# Patient Record
Sex: Female | Born: 1950 | Race: Asian | Hispanic: No | Marital: Married | Smoking: Never smoker
Health system: Southern US, Community
[De-identification: ages and names within clinical notes are randomized; demographics above are authoritative.]

## PROBLEM LIST (undated history)

## (undated) DIAGNOSIS — R42 Dizziness and giddiness: Secondary | ICD-10-CM

## (undated) DIAGNOSIS — I10 Essential (primary) hypertension: Secondary | ICD-10-CM

## (undated) DIAGNOSIS — M810 Age-related osteoporosis without current pathological fracture: Secondary | ICD-10-CM

## (undated) DIAGNOSIS — E785 Hyperlipidemia, unspecified: Secondary | ICD-10-CM

## (undated) HISTORY — DX: Hyperlipidemia, unspecified: E78.5

## (undated) HISTORY — DX: Age-related osteoporosis without current pathological fracture: M81.0

---

## 2012-04-20 ENCOUNTER — Emergency Department (HOSPITAL_COMMUNITY)
Admission: EM | Admit: 2012-04-20 | Discharge: 2012-04-20 | Disposition: A | Discharge status: Home / Self Care | Payer: BC Managed Care – PPO | Attending: Emergency Medicine | Admitting: Emergency Medicine

## 2012-04-20 ENCOUNTER — Emergency Department (HOSPITAL_COMMUNITY): Payer: BC Managed Care – PPO

## 2012-04-20 ENCOUNTER — Encounter (HOSPITAL_COMMUNITY): Payer: Self-pay | Admitting: *Deleted

## 2012-04-20 DIAGNOSIS — M8448XA Pathological fracture, other site, initial encounter for fracture: Secondary | ICD-10-CM | POA: Insufficient documentation

## 2012-04-20 DIAGNOSIS — I959 Hypotension, unspecified: Secondary | ICD-10-CM | POA: Insufficient documentation

## 2012-04-20 DIAGNOSIS — Z8639 Personal history of other endocrine, nutritional and metabolic disease: Secondary | ICD-10-CM | POA: Insufficient documentation

## 2012-04-20 DIAGNOSIS — Z862 Personal history of diseases of the blood and blood-forming organs and certain disorders involving the immune mechanism: Secondary | ICD-10-CM | POA: Insufficient documentation

## 2012-04-20 DIAGNOSIS — D649 Anemia, unspecified: Secondary | ICD-10-CM | POA: Insufficient documentation

## 2012-04-20 DIAGNOSIS — R110 Nausea: Secondary | ICD-10-CM | POA: Insufficient documentation

## 2012-04-20 DIAGNOSIS — S32010A Wedge compression fracture of first lumbar vertebra, initial encounter for closed fracture: Secondary | ICD-10-CM

## 2012-04-20 DIAGNOSIS — I10 Essential (primary) hypertension: Secondary | ICD-10-CM | POA: Insufficient documentation

## 2012-04-20 HISTORY — DX: Essential (primary) hypertension: I10

## 2012-04-20 HISTORY — DX: Dizziness and giddiness: R42

## 2012-04-20 LAB — CBC WITH DIFFERENTIAL/PLATELET
Basophils Relative: 0 % (ref 0–1)
Eosinophils Absolute: 0.2 10*3/uL (ref 0.0–0.7)
HCT: 33.5 % — ABNORMAL LOW (ref 36.0–46.0)
Hemoglobin: 11.5 g/dL — ABNORMAL LOW (ref 12.0–15.0)
Lymphs Abs: 1.5 10*3/uL (ref 0.7–4.0)
MCH: 26 pg (ref 26.0–34.0)
MCHC: 34.3 g/dL (ref 30.0–36.0)
MCV: 75.6 fL — ABNORMAL LOW (ref 78.0–100.0)
Monocytes Absolute: 0.7 10*3/uL (ref 0.1–1.0)
Monocytes Relative: 5 % (ref 3–12)

## 2012-04-20 LAB — URINALYSIS, ROUTINE W REFLEX MICROSCOPIC
Bilirubin Urine: NEGATIVE
Nitrite: NEGATIVE
Protein, ur: NEGATIVE mg/dL
Specific Gravity, Urine: 1.03 (ref 1.005–1.030)
Urobilinogen, UA: 1 mg/dL (ref 0.0–1.0)

## 2012-04-20 LAB — URINE MICROSCOPIC-ADD ON

## 2012-04-20 LAB — LIPASE, BLOOD: Lipase: 48 U/L (ref 11–59)

## 2012-04-20 LAB — COMPREHENSIVE METABOLIC PANEL
Albumin: 3.4 g/dL — ABNORMAL LOW (ref 3.5–5.2)
BUN: 19 mg/dL (ref 6–23)
Creatinine, Ser: 0.78 mg/dL (ref 0.50–1.10)
GFR calc Af Amer: >90 mL/min (ref >90)
Glucose, Bld: 113 mg/dL — ABNORMAL HIGH (ref 70–99)
Total Bilirubin: 0.6 mg/dL (ref 0.3–1.2)
Total Protein: 7 g/dL (ref 6.0–8.3)

## 2012-04-20 LAB — TROPONIN I: Troponin I: <0.3 ng/mL (ref <0.30)

## 2012-04-20 MED ORDER — MORPHINE SULFATE 4 MG/ML IJ SOLN
4.0000 mg | Freq: Once | INTRAMUSCULAR | Status: AC
  Administered 2012-04-20: 4 mg via INTRAVENOUS
  Filled 2012-04-20: qty 1

## 2012-04-20 MED ORDER — IOHEXOL 300 MG/ML  SOLN
100.0000 mL | Freq: Once | INTRAMUSCULAR | Status: AC | PRN
  Administered 2012-04-20: 80 mL via INTRAVENOUS

## 2012-04-20 MED ORDER — SODIUM CHLORIDE 0.9 % IV SOLN
1000.0000 mL | INTRAVENOUS | Status: DC
  Administered 2012-04-20: 1000 mL via INTRAVENOUS

## 2012-04-20 MED ORDER — IOHEXOL 300 MG/ML  SOLN: 25.0000 mL | INTRAMUSCULAR | Status: AC

## 2012-04-20 MED ORDER — ONDANSETRON HCL 4 MG/2ML IJ SOLN
4.0000 mg | Freq: Once | INTRAMUSCULAR | Status: AC
  Administered 2012-04-20: 4 mg via INTRAVENOUS
  Filled 2012-04-20: qty 2

## 2012-04-20 MED ORDER — OXYCODONE-ACETAMINOPHEN 5-325 MG PO TABS: 1.0000 | ORAL_TABLET | ORAL | Status: DC | PRN

## 2012-04-20 MED ORDER — SODIUM CHLORIDE 0.9 % IV SOLN
1000.0000 mL | Freq: Once | INTRAVENOUS | Status: AC
  Administered 2012-04-20: 1000 mL via INTRAVENOUS

## 2012-04-20 NOTE — ED Notes (Signed)
Pt states pain started at 0600. No fever noted. No painful or difficulty with urination. No vomiting. No abd pain. States when she started hurting pain was in lower back, but now it is radiating up to mid back

## 2012-04-20 NOTE — ED Notes (Signed)
[employer] interp [id number] assisted in discharge instructions and verifying understanding.

## 2012-04-20 NOTE — ED Notes (Signed)
MD at bedside. 

## 2012-04-20 NOTE — ED Notes (Signed)
Lactic acid results shown to Dr. Glick 

## 2012-04-20 NOTE — ED Notes (Signed)
Per report from [facility] pt awoke this am with a severe lower back pain.  She was reported to be hypotensive on arrival of EMS systolic BP in the 80's.  NS 400 ml bolus IV administered PTA.  Pt appears to be uncomfortable.

## 2012-04-20 NOTE — ED Notes (Signed)
Pt interviewed via [employer] Language [place]

## 2012-04-20 NOTE — ED Provider Notes (Signed)
History     CSN: [id number]  Arrival date & time 04/20/12  0702   First MD Initiated Contact with Patient 04/20/12 0720      Chief Complaint  Patient presents with  . Back Pain    (Consider location/radiation/quality/duration/timing/severity/associated sxs/prior treatment) Patient is a 62 y.o. female presenting with back pain. The history is provided by the patient and a relative. A language interpreter was used.  Back Pain She was awakened at 6 AM by severe pain across her lower back without radiation. There is associated nausea but no vomiting. She denied dizziness, chest pain, abdominal pain. Pain did not radiate. It is worse when she sits up. She's not able to put a number on the pain. She has a mild aching yesterday. She is normally quite active. She does have history of hypertension and hyperlipidemia but does not have a PCP. EMS noted low blood pressure initially and gave her a fluid bolus.  Past Medical History  Diagnosis Date  . Hypertension   . Vertigo     No past surgical history on file.  No family history on file.  History  Substance Use Topics  . Smoking status: Not on file  . Smokeless tobacco: Not on file  . Alcohol Use: Not on file    OB History   Grav Para Term Preterm Abortions TAB SAB Ect Mult Living                  Review of Systems  Musculoskeletal: Positive for back pain.  All other systems reviewed and are negative.    Allergies  Review of patient's allergies indicates not on file.  Home Medications  No current outpatient prescriptions on file.  BP 104/73  Pulse 73  Temp(Src) 98.7 F (37.1 C) (Oral)  Resp 24  SpO2 93%  Physical Exam  Nursing note and vitals reviewed.  62 year old female, who appears uncomfortable, but is in no acute distress. Vital signs are significant for tachypnea with respiratory rate of 24. Oxygen saturation is 93%, which is normal. Head is normocephalic and atraumatic. PERRLA, EOMI. Oropharynx is  clear. Neck is nontender and supple without adenopathy or JVD. Back is nontender and there is no CVA tenderness. Lungs are clear without rales, wheezes, or rhonchi. Chest is nontender. Heart has regular rate and rhythm without murmur. Abdomen is soft, flat, nontender without masses or hepatosplenomegaly and peristalsis is normoactive. Extremities have no cyanosis or edema, full range of motion is present. No pulsatile mass is palpable there no bruits. Femoral pulses are strong and equal. Skin is warm and dry without rash. Neurologic: Mental status is normal, cranial nerves are intact, there are no motor or sensory deficits.  ED Course  Procedures (including critical care time)  Results for orders placed during the hospital encounter of 04/20/12  CBC WITH DIFFERENTIAL      Result Value Range   WBC 13.1 (*) 4.0 - 10.5 K/uL   RBC 4.43  3.87 - 5.11 MIL/uL   Hemoglobin 11.5 (*) 12.0 - 15.0 g/dL   HCT 74.2 (*) 59.5 - 63.8 %   MCV 75.6 (*) 78.0 - 100.0 fL   MCH 26.0  26.0 - 34.0 pg   MCHC 34.3  30.0 - 36.0 g/dL   RDW 75.6  43.3 - 29.5 %   Platelets 190  150 - 400 K/uL   Neutrophils Relative 82 (*) 43 - 77 %   Neutro Abs 10.7 (*) 1.7 - 7.7 K/uL   Lymphocytes Relative  12  12 - 46 %   Lymphs Abs 1.5  0.7 - 4.0 K/uL   Monocytes Relative 5  3 - 12 %   Monocytes Absolute 0.7  0.1 - 1.0 K/uL   Eosinophils Relative 2  0 - 5 %   Eosinophils Absolute 0.2  0.0 - 0.7 K/uL   Basophils Relative 0  0 - 1 %   Basophils Absolute 0.0  0.0 - 0.1 K/uL  COMPREHENSIVE METABOLIC PANEL      Result Value Range   Sodium 137  135 - 145 mEq/L   Potassium 3.7  3.5 - 5.1 mEq/L   Chloride 101  96 - 112 mEq/L   CO2 27  19 - 32 mEq/L   Glucose, Bld 113 (*) 70 - 99 mg/dL   BUN 19  6 - 23 mg/dL   Creatinine, Ser 8.46  0.50 - 1.10 mg/dL   Calcium 8.4  8.4 - 96.2 mg/dL   Total Protein 7.0  6.0 - 8.3 g/dL   Albumin 3.4 (*) 3.5 - 5.2 g/dL   AST 19  0 - 37 U/L   ALT 11  0 - 35 U/L   Alkaline Phosphatase 48  39 -  117 U/L   Total Bilirubin 0.6  0.3 - 1.2 mg/dL   GFR calc non Af Amer 87 (*) >90 mL/min   GFR calc Af Amer >90  >90 mL/min  LIPASE, BLOOD      Result Value Range   Lipase 48  11 - 59 U/L  TROPONIN I      Result Value Range   Troponin I <0.30  <0.30 ng/mL  URINALYSIS, ROUTINE W REFLEX MICROSCOPIC      Result Value Range   Color, Urine YELLOW  YELLOW   APPearance CLEAR  CLEAR   Specific Gravity, Urine 1.030  1.005 - 1.030   pH 6.5  5.0 - 8.0   Glucose, UA NEGATIVE  NEGATIVE mg/dL   Hgb urine dipstick NEGATIVE  NEGATIVE   Bilirubin Urine NEGATIVE  NEGATIVE   Ketones, ur 15 (*) NEGATIVE mg/dL   Protein, ur NEGATIVE  NEGATIVE mg/dL   Urobilinogen, UA 1.0  0.0 - 1.0 mg/dL   Nitrite NEGATIVE  NEGATIVE   Leukocytes, UA MODERATE (*) NEGATIVE  URINE MICROSCOPIC-ADD ON      Result Value Range   Squamous Epithelial / LPF RARE  RARE   WBC, UA 0-2  <3 WBC/hpf   RBC / HPF 0-2  <3 RBC/hpf   Bacteria, UA RARE  RARE   Casts HYALINE CASTS (*) NEGATIVE  CG4 I-STAT (LACTIC ACID)      Result Value Range   Lactic Acid, Venous 1.85  0.5 - 2.2 mmol/L   Ct Abdomen Pelvis W Contrast  04/20/2012  *RADIOLOGY REPORT*  Clinical Data: Lower to mid back pain with nausea and vomiting.  CT ABDOMEN AND PELVIS WITH CONTRAST  Technique:  Multidetector CT imaging of the abdomen and pelvis was performed following the standard protocol during bolus administration of intravenous contrast.  Contrast: 80mL OMNIPAQUE IOHEXOL 300 MG/ML  SOLN  Comparison: Chest radiographs same date.  Findings: Images through the lung bases demonstrate mild basilar atelectasis and a small hiatal hernia.  There is no pleural effusion.  The liver, gallbladder, biliary system and pancreas appear unremarkable.  The spleen, adrenal glands and kidneys appear unremarkable.  The stomach, small bowel, appendix and colon appear normal.  There is minimal aorto iliac atherosclerosis.  No enlarged abdominal pelvic lymph nodes are seen.  Uterus, ovaries and  bladder appear normal.  There is a mild acute appearing superior endplate compression deformity at L1.  There is no associated osseous retropulsion or significant paraspinal hematoma.  No other fractures are identified.  IMPRESSION:  1.  Acute appearing mild superior endplate compression deformity at L1. 2.  No acute abdominal pelvic findings.   Original Report Authenticated By: Carey Bullocks, M.D.    Dg Chest Portable 1 View  04/20/2012  *RADIOLOGY REPORT*  Clinical Data: Low back pain from fall.  Cough and congestion. History of hypertension.  PORTABLE CHEST - 1 VIEW  Comparison: None.  Findings: 0804 hours.  The heart size is normal.  There is mild aortic atherosclerosis.  There is no evidence of mediastinal hematoma.  The lungs are clear.  There is no pleural effusion or pneumothorax.  No fractures are identified.  Several telemetry leads overlie the chest.  IMPRESSION: No acute post-traumatic findings or active cardiopulmonary process identified.   Original Report Authenticated By: Carey Bullocks, M.D.    Images viewed by me.   Date: 04/20/2012  Rate: 82  Rhythm: normal sinus rhythm  QRS Axis: normal  Intervals: QT prolonged  ST/T Wave abnormalities: normal  Conduction Disutrbances:none  Narrative Interpretation: Low voltage, prolonged QT interval. No prior ECG available for comparison.  Old EKG Reviewed: none available    1. Compression fracture of L1 lumbar vertebra, closed, initial encounter   2. Hypotension   3. Anemia       MDM  Low back pain with hypotension worrisome for possible abdominal aortic aneurysm. Limited bedside ultrasound was done to evaluate the aorta which was found to be of normal caliber without evidence of aneurysm. Images were saved. She will be sent for CT scan for further evaluation.   CT scan shows compression fracture of L1. Patient is reexamined and she does seem to have localized tenderness in the upper lumbar spine. Mild anemia is noted and mild  elevated WBC but that could be because of pain. We have no previous lab results 2 used for comparison. Orthostatic static vital signs will be checked and if okay, anticipate sending her home with narcotics for pain control.  Orthostatic vital signs showed no change in pulse or blood pressure. I expect that her initial hypotension was probably a vasovagal response. No evidence of any significant bleeding or fluid loss. Patient got moderate relief with a dose of morphine. She's given a prescription for Percocet and she is to followup with neurosurgery. She may need to be considered for a kyphoplasty.    Dione Booze, MD 04/20/12 1248

## 2012-04-20 NOTE — ED Notes (Signed)
Son-in-law on way to pick pt up.

## 2012-07-14 ENCOUNTER — Ambulatory Visit (INDEPENDENT_AMBULATORY_CARE_PROVIDER_SITE_OTHER): Payer: BC Managed Care – PPO | Admitting: Emergency Medicine

## 2012-07-14 VITALS — BP 96/58 | HR 76 | Temp 98.5°F | Resp 16 | Ht 65.0 in | Wt 120.0 lb

## 2012-07-14 DIAGNOSIS — R55 Syncope and collapse: Secondary | ICD-10-CM

## 2012-07-14 DIAGNOSIS — I951 Orthostatic hypotension: Secondary | ICD-10-CM

## 2012-07-14 DIAGNOSIS — I10 Essential (primary) hypertension: Secondary | ICD-10-CM

## 2012-07-14 NOTE — Progress Notes (Signed)
[facility] [address] [place] [address] [phone]  Date:  07/14/2012   Name:  Katrina Baird   DOB:  [birth year]   MRN:  [id number]  PCP:  No PCP Per Patient    Chief Complaint: Dizziness  The history is provided by the patient and a relative. A language interpreter was used.    History of Present Illness:  Katrina Baird is a 62 y.o. very pleasant female patient who presents with the following:  The patient has a history of hypertension on multiple drugs.  She was seen in the ER for dizziness in April and at that time had apparently fallen and was noted to have a blood pressure of 105 systolic.  She has continued to be dizzy and two days ago had an episode of orthostatic hypotension and became dizzy and fell to the ground after standing up.  The son thinks she has lost too much blood from a dental extraction but she has no history of excessive bleeding.  She has no chest pain, nausea or vomiting or shortness of breath.  No improvement with over the counter medications or other home remedies. Denies other complaint or health concern today.   There are no active problems to display for this patient.   Past Medical History  Diagnosis Date  . Hypertension   . Vertigo     History reviewed. No pertinent past surgical history.  History  Substance Use Topics  . Smoking status: Never Smoker   . Smokeless tobacco: Not on file  . Alcohol Use: Not on file    History reviewed. No pertinent family history.  No Known Allergies  Medication list has been reviewed and updated.  Current Outpatient Prescriptions on File Prior to Visit  Medication Sig Dispense Refill  . amLODipine (NORVASC) 5 MG tablet Take 5 mg by mouth daily.      Marland Kitchen ibuprofen (ADVIL,MOTRIN) 200 MG tablet Take 200 mg by mouth every 6 (six) hours as needed for pain.      Marland Kitchen lisinopril-hydrochlorothiazide (PRINZIDE,ZESTORETIC) 20-12.5 MG per tablet Take 1 tablet by mouth daily.      . meclizine (ANTIVERT)  25 MG tablet Take 12.5 mg by mouth 3 (three) times daily as needed for dizziness.      Marland Kitchen oxyCODONE-acetaminophen (PERCOCET/ROXICET) 5-325 MG per tablet Take 1 tablet by mouth every 4 (four) hours as needed for pain.  30 tablet  0   No current facility-administered medications on file prior to visit.    Review of Systems:  As per HPI, otherwise negative.    Physical Examination: Filed Vitals:   07/14/12 2032  BP: 98/60  Pulse: 76  Temp: 98.5 F (36.9 C)  Resp: 16   Filed Vitals:   07/14/12 2032  Height: 5\' 5"  (1.651 m)  Weight: 120 lb (54.432 kg)   Body mass index is 19.97 kg/(m^2). Ideal Body Weight: Weight in (lb) to have BMI = 25: 149.9  GEN: WDWN, NAD, Non-toxic, A & O x 3 HEENT: Atraumatic, Normocephalic. Neck supple. No masses, No LAD. Ears and Nose: No external deformity. CV: RRR, No M/G/R. No JVD. No thrill. No extra heart sounds. PULM: CTA B, no wheezes, crackles, rhonchi. No retractions. No resp. distress. No accessory muscle use. ABD: S, NT, ND, +BS. No rebound. No HSM. EXTR: No c/c/e NEURO Normal gait.  PSYCH: Normally interactive. Conversant. Not depressed or anxious appearing.  Calm demeanor.    Assessment and Plan: Orthostatic hypotension Hypertension with overmedication Stop  amlodipine Contact your doctor TOMORROW.   Signed,  Phillips Odor, MD

## 2012-07-14 NOTE — Patient Instructions (Addendum)
STOP THE AMLODIPINE CALL YOUR DOCTOR TOMORROW  Ng?t  (Syncope)  Ng?t l tnh tr?ng chong trong m?t lt. ?i?u ny c ngh?a l ng??i b?nh m?t  th?c v ng xu?ng ??t. Ng??i b?nh th??ng b?t t?nh trong ch?a ??y 5 pht. Ng??i b?nh c th? c m?t s? co gi?t c? b?p ln ??n 15 giy tr??c khi th?c d?y v tr? l?i bnh th??ng. Ng?t x?y ra th??ng xuyn h?n ? nh?ng ng??i cao tu?i, nh?ng n c th? x?y ra v?i b?t c? ai. Trong khi h?u h?t nguyn nhn ng?t l khng nguy hi?m, ng?t c th? l d?u hi?u c?a m?t v?n ?? s?c kh?e nghim tr?ng. C?n s?m tham v?n v?i chuyn gia y t?.  NGUYN NHN  Ng?t l do dng mu ln no gi?m ??t ng?t. Nguyn nhn c? th? th??ng khng ???c xc ??nh. Cc y?u t? c th? gy ra ng?t bao g?m:   Dng thu?c h? huy?t p.  Thay ??i t? th? ??t ng?t, ch?ng h?n nh? ??ng ln ??t ng?t.  Dng nhi?u thu?c h?n ???c ch? ??nh.  ??ng ? m?t n?i qu lu.  R?i lo?n ??ng kinh.  M?t n??c v ti?p xc qu nhi?u v?i nhi?t.  L??ng ???ng trong mu th?p (h? ???ng huy?t).  C?ng th?ng ?? ??i ti?n.  B?nh tim, nh?p tim khng ??u ho?c cc v?n ?? tu?n hon khc.  S? hi, ?au kh? v? tnh c?m, nhn th?y mu ho?c ?au n?ng. TRI?U CH?NG  Ngay tr??c khi ng?t, b?n c th?:   C?m th?y chng m?t ho?c ?au ??u.  C?m th?y bu?n nn.  Nhn th?y ton mu tr?ng ho?c ton mu ?en trong th? tr??ng c?a b?n.  Da l?nh, ?m. CH?N ?ON  Chuyn gia ch?m [place] y t? s? h?i v? cc tri?u ch?ng, khm tr?c ti?p v ?o ?i?n tm ?? (ECG) ?? ghi l?i ho?t ??ng ?i?n c?a tim. Chuyn gia ch?m [place] y t? c?ng c th? th?c hi?n cc xt nghi?m tim khc ho?c xt nghi?m mu ?? xc ??nh nguyn nhn gy ng?t.  ?I?U TR?  H?u h?t cc tr??ng h?p khng c?n ?i?u tr?Marland Kitchen Ty thu?c vo nguyn nhn gy ng?t, chuyn gia ch?m [place] y t? c th? [place] b?n nn thay ??i ho?c d?ng m?t s? cc lo?i thu?c c?a b?n.  H??NG D?N CH?M [place] T?I NH   Nh? ai ? ? l?i cng b?n cho ??n khi b?n c?m th?y ?n ??nh.  Khng li xe, v?n hnh my mc, ho?c ch?i th? thao cho ??n khi  chuyn gia ch?m [place] y t? ni b?n c th? lm nh?ng vi?c ny.  Gi? m?i cu?c h?n khm l?i theo ch? d?n c?a chuyn gia ch?m [place] y t?.  N?m xu?ng ngay l?p t?c n?u b?n b?t ??u c?m th?y nh? b?n c th? ng?t. Ht th? su v ??u. Ch? cho ??n khi t?t c? cc tri?u ch?ng qua ?i.  U?ng ?? n??c ?? gi? cho n??c ti?u trong ho?c vng nh?t.  N?u b?n ?ang dng thu?c huy?t p hay tim, ??ng d?y t? t?, dnh vi pht ?? ng?i v sau ? ??ng. ?i?u ny c th? gi?m hoa m?t. HY NGAY L?P T?C THAM V?N V?I CHUYN GIA Y T? N?U:   B?n b? ?au ??u n?ng.  B?n b? ?au b?t th??ng ? ng?c, b?ng ho?c l?ng.  B?n b? ch?y mu t? mi?ng ho?c tr?c trng, ho?c ?i ngoi ra phn c mu ?en ho?c mu h?c n.  B?n c nh?p tim khng ??u  ho?c r?t nhanh.  B?n b? ?au khi th?.  B?n b? ng?t l?p ?i l?p l?i ho?c co gi?t gi?ng nh? ??ng kinh trong lc ng?t.  B?n ng?t khi ng?i ho?c n?m xu?ng.  B?n b? l?n.  B?n ?i ??ng kh kh?n.  B?n b? suy nh??c nghim tr?ng.  B?n c v?n ?? v? th? l?c. N?u b?n ng?t, hy g?i cc d?ch v? kh?n c?p ??a ph??ng c?a b?n (911 t?i [place] Khng t? li xe ??n b?nh vi?n.  ??M B?O B?N:   Hi?u cc h??ng d?n ny.  S? theo di tnh tr?ng c?a mnh.  S? yu c?u tr? gip ngay l?p t?c n?u b?n c?m th?y khng kh?e ho?c tnh tr?ng tr? nn t?i h?n. Document Released: 07/02/2011 [facility] Patient Information 2014 [place], [place].

## 2012-09-23 ENCOUNTER — Encounter: Payer: Self-pay | Admitting: Internal Medicine

## 2012-09-23 ENCOUNTER — Ambulatory Visit (INDEPENDENT_AMBULATORY_CARE_PROVIDER_SITE_OTHER): Payer: BC Managed Care – PPO | Admitting: Internal Medicine

## 2012-09-23 ENCOUNTER — Other Ambulatory Visit (INDEPENDENT_AMBULATORY_CARE_PROVIDER_SITE_OTHER): Payer: BC Managed Care – PPO

## 2012-09-23 VITALS — BP 150/108 | HR 69 | Temp 98.1°F | Resp 16 | Ht 65.0 in | Wt 125.0 lb

## 2012-09-23 DIAGNOSIS — I10 Essential (primary) hypertension: Secondary | ICD-10-CM

## 2012-09-23 DIAGNOSIS — D72829 Elevated white blood cell count, unspecified: Secondary | ICD-10-CM

## 2012-09-23 DIAGNOSIS — R7309 Other abnormal glucose: Secondary | ICD-10-CM

## 2012-09-23 DIAGNOSIS — D649 Anemia, unspecified: Secondary | ICD-10-CM | POA: Insufficient documentation

## 2012-09-23 DIAGNOSIS — E785 Hyperlipidemia, unspecified: Secondary | ICD-10-CM | POA: Insufficient documentation

## 2012-09-23 DIAGNOSIS — M810 Age-related osteoporosis without current pathological fracture: Secondary | ICD-10-CM

## 2012-09-23 DIAGNOSIS — M546 Pain in thoracic spine: Secondary | ICD-10-CM | POA: Insufficient documentation

## 2012-09-23 DIAGNOSIS — E782 Mixed hyperlipidemia: Secondary | ICD-10-CM

## 2012-09-23 HISTORY — DX: Hyperlipidemia, unspecified: E78.5

## 2012-09-23 LAB — CBC WITH DIFFERENTIAL/PLATELET
Eosinophils Relative: 1.8 % (ref 0.0–5.0)
Lymphocytes Relative: 35.3 % (ref 12.0–46.0)
Monocytes Relative: 3.9 % (ref 3.0–12.0)
Neutrophils Relative %: 58.5 % (ref 43.0–77.0)
Platelets: 244 10*3/uL (ref 150.0–400.0)
WBC: 5 10*3/uL (ref 4.5–10.5)

## 2012-09-23 LAB — URINALYSIS, ROUTINE W REFLEX MICROSCOPIC
Nitrite: NEGATIVE
RBC / HPF: NONE SEEN (ref 0–?)
Specific Gravity, Urine: 1.01 (ref 1.000–1.030)
Urine Glucose: NEGATIVE
Urobilinogen, UA: 0.2 (ref 0.0–1.0)
WBC, UA: NONE SEEN (ref 0–?)

## 2012-09-23 LAB — COMPREHENSIVE METABOLIC PANEL
Albumin: 4.4 g/dL (ref 3.5–5.2)
CO2: 30 mEq/L (ref 19–32)
GFR: 93.99 mL/min (ref >60.00)
Glucose, Bld: 97 mg/dL (ref 70–99)
Potassium: 3.8 mEq/L (ref 3.5–5.1)
Sodium: 139 mEq/L (ref 135–145)
Total Protein: 8.3 g/dL (ref 6.0–8.3)

## 2012-09-23 LAB — IBC PANEL: Saturation Ratios: 20.4 % (ref 20.0–50.0)

## 2012-09-23 LAB — FERRITIN: Ferritin: 75.2 ng/mL (ref 10.0–291.0)

## 2012-09-23 LAB — LIPID PANEL
Cholesterol: 330 mg/dL — ABNORMAL HIGH (ref 0–200)
VLDL: 20.6 mg/dL (ref 0.0–40.0)

## 2012-09-23 MED ORDER — OLMESARTAN-AMLODIPINE-HCTZ 40-10-25 MG PO TABS: 1.0000 | ORAL_TABLET | Freq: Every day | ORAL | Status: DC

## 2012-09-23 NOTE — Assessment & Plan Note (Signed)
Will check her Vit D level Have asked her to have a DEXA scan done

## 2012-09-23 NOTE — Assessment & Plan Note (Signed)
Her BP is not well controlled I have upgraded her BP med to tribenzor (high dose). Today I will check her labs to look for secondary causes and end organ damage

## 2012-09-23 NOTE — Assessment & Plan Note (Signed)
I will check her CBC and her iron levels I have asked her to see GI to see if there is blood loss on the intestinal tract

## 2012-09-23 NOTE — Assessment & Plan Note (Signed)
FLP today 

## 2012-09-23 NOTE — Progress Notes (Signed)
Subjective:    Patient ID: Katrina Baird, female    DOB: 21-Oct-1950, 62 y.o.   MRN: [id number]  HPI Comments: New to me for management of chronic medical chronic problems - she does not want to a physical today. She has previously seen an MD in [facility]. No records are available today. She is from [place], moved to the [place] one year ago. She does not speak english, her son-in-law is her translator today.  Hypertension This is a chronic problem. The current episode started more than 1 year ago. The problem is unchanged. The problem is uncontrolled. Pertinent negatives include no anxiety, blurred vision, chest pain, headaches, malaise/fatigue, neck pain, orthopnea, palpitations, peripheral edema, PND, shortness of breath or sweats. Past treatments include ACE inhibitors and calcium channel blockers. The current treatment provides mild improvement. There are no compliance problems.       Review of Systems  Constitutional: Negative.  Negative for malaise/fatigue.  HENT: Negative.  Negative for neck pain.   Eyes: Negative.  Negative for blurred vision.  Respiratory: Negative.  Negative for cough, choking, chest tightness, shortness of breath, wheezing and stridor.   Cardiovascular: Negative.  Negative for chest pain, palpitations, orthopnea, leg swelling and PND.  Gastrointestinal: Negative.  Negative for nausea, vomiting, abdominal pain, diarrhea and constipation.  Endocrine: Negative.   Genitourinary: Negative.  Negative for dysuria, urgency, frequency, hematuria, flank pain and difficulty urinating.  Musculoskeletal: Positive for back pain (she has had pain in the mid-T spine area for 2 years). Negative for myalgias, joint swelling, arthralgias and gait problem.  Skin: Negative.   Allergic/Immunologic: Negative.   Neurological: Negative.  Negative for dizziness, tremors, syncope, weakness, light-headedness and headaches.  Hematological: Negative.  Negative for adenopathy. Does not bruise/bleed  easily.  Psychiatric/Behavioral: Negative.        Objective:   Physical Exam  Vitals reviewed. Constitutional: She is oriented to person, place, and time. She appears well-developed and well-nourished. No distress.  HENT:  Head: Normocephalic and atraumatic.  Mouth/Throat: Oropharynx is clear and moist. No oropharyngeal exudate.  Eyes: Conjunctivae are normal. Right eye exhibits no discharge. Left eye exhibits no discharge. No scleral icterus.  Neck: Normal range of motion. Neck supple. No JVD present. No tracheal deviation present. No thyromegaly present.  Cardiovascular: Normal rate, regular rhythm, normal heart sounds and intact distal pulses.  Exam reveals no gallop and no friction rub.   No murmur heard. Pulmonary/Chest: Effort normal and breath sounds normal. No stridor. No respiratory distress. She has no wheezes. She has no rales. She exhibits no tenderness.  Abdominal: Soft. Bowel sounds are normal. She exhibits no distension and no mass. There is no tenderness. There is no rebound and no guarding.  Musculoskeletal: Normal range of motion. She exhibits no edema and no tenderness.  Lymphadenopathy:    She has no cervical adenopathy.  Neurological: She is oriented to person, place, and time.  Skin: Skin is warm and dry. No rash noted. She is not diaphoretic. No erythema. No pallor.  Psychiatric: She has a normal mood and affect. Her behavior is normal. Judgment and thought content normal.      Lab Results  Component Value Date   WBC 13.1* 04/20/2012   HGB 11.5* 04/20/2012   HCT 33.5* 04/20/2012   PLT 190 04/20/2012   GLUCOSE 113* 04/20/2012   ALT 11 04/20/2012   AST 19 04/20/2012   NA 137 04/20/2012   K 3.7 04/20/2012   CL 101 04/20/2012   CREATININE 0.78 04/20/2012  BUN 19 04/20/2012   CO2 27 04/20/2012      Assessment & Plan:

## 2012-09-23 NOTE — Assessment & Plan Note (Signed)
I will check a plain film to see if there is a lyitc lesion, occult fracture, DDD, etc

## 2012-09-23 NOTE — Assessment & Plan Note (Signed)
I will recheck her CBC and will look at an SPEP as well to see if there is lymphoproliferative disease

## 2012-09-23 NOTE — Assessment & Plan Note (Signed)
I will check her A1C to see if she has DM2 

## 2012-09-23 NOTE — Patient Instructions (Signed)

## 2012-09-24 ENCOUNTER — Telehealth: Payer: Self-pay | Admitting: *Deleted

## 2012-09-24 DIAGNOSIS — M810 Age-related osteoporosis without current pathological fracture: Secondary | ICD-10-CM

## 2012-09-24 DIAGNOSIS — M8440XS Pathological fracture, unspecified site, sequela: Secondary | ICD-10-CM

## 2012-09-24 LAB — VITAMIN D 25 HYDROXY (VIT D DEFICIENCY, FRACTURES): Vit D, 25-Hydroxy: 35 ng/mL (ref 30–89)

## 2012-09-24 NOTE — Telephone Encounter (Signed)
She has had a fragility fracture in her right rib cage

## 2012-09-24 NOTE — Telephone Encounter (Signed)
Received message from [place] at [facility]. Wanted to know if pt had had previous DEXA done in this area? Per office note pt just moved to this area x 1 year. They are needing additional dx code to support osteoporosis dx. Wants to know if pt is post menopausal? If so states that dx will work for [employer].  Please advise.

## 2012-09-24 NOTE — Telephone Encounter (Signed)
Dexa order re-entered with updated dx code. Notified Victorino Dike at the [facility].

## 2012-09-25 ENCOUNTER — Encounter: Payer: Self-pay | Admitting: Internal Medicine

## 2012-09-25 LAB — PROTEIN ELECTROPHORESIS, SERUM
Beta 2: 4.4 % (ref 3.2–6.5)
Beta Globulin: 5.9 % (ref 4.7–7.2)
Gamma Globulin: 20.5 % — ABNORMAL HIGH (ref 11.1–18.8)
Total Protein, Serum Electrophoresis: 8.2 g/dL (ref 6.0–8.3)

## 2012-11-02 ENCOUNTER — Encounter: Payer: Self-pay | Admitting: Internal Medicine

## 2012-11-02 ENCOUNTER — Ambulatory Visit (INDEPENDENT_AMBULATORY_CARE_PROVIDER_SITE_OTHER): Payer: BC Managed Care – PPO | Admitting: Internal Medicine

## 2012-11-02 VITALS — BP 126/72 | HR 77 | Temp 98.2°F | Resp 16 | Ht 65.0 in | Wt 128.5 lb

## 2012-11-02 DIAGNOSIS — E785 Hyperlipidemia, unspecified: Secondary | ICD-10-CM

## 2012-11-02 DIAGNOSIS — I10 Essential (primary) hypertension: Secondary | ICD-10-CM

## 2012-11-02 DIAGNOSIS — Z23 Encounter for immunization: Secondary | ICD-10-CM

## 2012-11-02 MED ORDER — OLMESARTAN-AMLODIPINE-HCTZ 40-10-25 MG PO TABS: 1.0000 | ORAL_TABLET | Freq: Every day | ORAL | Status: DC

## 2012-11-02 MED ORDER — EZETIMIBE 10 MG PO TABS: 10.0000 mg | ORAL_TABLET | Freq: Every day | ORAL | Status: DC

## 2012-11-02 MED ORDER — ROSUVASTATIN CALCIUM 20 MG PO TABS: 20.0000 mg | ORAL_TABLET | Freq: Every day | ORAL | Status: DC

## 2012-11-02 NOTE — Assessment & Plan Note (Signed)
Her LDL is very high I have asked her to start crestor and zetia

## 2012-11-02 NOTE — Assessment & Plan Note (Signed)
Her BP is well controlled 

## 2012-11-02 NOTE — Addendum Note (Signed)
Addended by: Rock Nephew T on: 11/02/2012 09:47 AM   Modules accepted: Orders

## 2012-11-02 NOTE — Patient Instructions (Signed)

## 2012-11-02 NOTE — Progress Notes (Signed)
  Subjective:    Patient ID: Katrina Baird, female    DOB: 1950-01-21, 62 y.o.   MRN: [id number]  Hyperlipidemia This is a chronic problem. The current episode started more than 1 year ago. The problem is uncontrolled. Recent lipid tests were reviewed and are variable. She has no history of chronic renal disease, diabetes, hypothyroidism, liver disease, obesity or nephrotic syndrome. There are no known factors aggravating her hyperlipidemia. Pertinent negatives include no chest pain, focal sensory loss, focal weakness, leg pain, myalgias or shortness of breath. She is currently on no antihyperlipidemic treatment. The current treatment provides no improvement of lipids.      Review of Systems  Constitutional: Negative.  Negative for fever, diaphoresis, appetite change and fatigue.  HENT: Negative.   Eyes: Negative.   Respiratory: Negative.  Negative for cough, choking, chest tightness, shortness of breath and stridor.   Cardiovascular: Negative.  Negative for chest pain, palpitations and leg swelling.  Gastrointestinal: Negative.  Negative for nausea, vomiting, abdominal pain, diarrhea and constipation.  Endocrine: Negative.   Genitourinary: Negative.   Musculoskeletal: Negative.  Negative for arthralgias, back pain, gait problem, joint swelling, myalgias, neck pain and neck stiffness.  Skin: Negative.   Allergic/Immunologic: Negative.   Neurological: Negative.  Negative for dizziness, tremors, focal weakness, weakness, light-headedness and headaches.  Hematological: Negative.  Negative for adenopathy. Does not bruise/bleed easily.  Psychiatric/Behavioral: Negative.        Objective:   Physical Exam  Vitals reviewed. Constitutional: She is oriented to person, place, and time. She appears well-developed and well-nourished. No distress.  HENT:  Head: Normocephalic and atraumatic.  Mouth/Throat: Oropharynx is clear and moist. No oropharyngeal exudate.  Eyes: Conjunctivae are normal. Right eye  exhibits no discharge. Left eye exhibits no discharge. No scleral icterus.  Neck: Normal range of motion. Neck supple. No JVD present. No tracheal deviation present. No thyromegaly present.  Cardiovascular: Normal rate, regular rhythm, normal heart sounds and intact distal pulses.  Exam reveals no gallop and no friction rub.   No murmur heard. Pulmonary/Chest: Effort normal and breath sounds normal. No stridor. No respiratory distress. She has no wheezes. She has no rales. She exhibits no tenderness.  Abdominal: Soft. Bowel sounds are normal. She exhibits no distension and no mass. There is no tenderness. There is no rebound and no guarding.  Musculoskeletal: Normal range of motion. She exhibits no edema and no tenderness.  Lymphadenopathy:    She has no cervical adenopathy.  Neurological: She is oriented to person, place, and time.  Skin: Skin is warm and dry. No rash noted. She is not diaphoretic. No erythema. No pallor.  Psychiatric: She has a normal mood and affect. Her behavior is normal. Judgment and thought content normal.     Lab Results  Component Value Date   WBC 5.0 09/23/2012   HGB 13.4 09/23/2012   HCT 40.2 09/23/2012   PLT 244.0 09/23/2012   GLUCOSE 97 09/23/2012   CHOL 330* 09/23/2012   TRIG 103.0 09/23/2012   HDL 47.00 09/23/2012   LDLDIRECT 261.6 09/23/2012   ALT 11 09/23/2012   AST 16 09/23/2012   NA 139 09/23/2012   K 3.8 09/23/2012   CL 102 09/23/2012   CREATININE 0.7 09/23/2012   BUN 13 09/23/2012   CO2 30 09/23/2012   TSH 0.76 09/23/2012   HGBA1C 4.7 09/23/2012        Assessment & Plan:

## 2013-03-03 ENCOUNTER — Ambulatory Visit: Payer: BC Managed Care – PPO | Admitting: Internal Medicine

## 2013-03-08 ENCOUNTER — Ambulatory Visit (INDEPENDENT_AMBULATORY_CARE_PROVIDER_SITE_OTHER): Payer: BC Managed Care – PPO | Admitting: Internal Medicine

## 2013-03-08 ENCOUNTER — Other Ambulatory Visit (INDEPENDENT_AMBULATORY_CARE_PROVIDER_SITE_OTHER): Payer: BC Managed Care – PPO

## 2013-03-08 ENCOUNTER — Encounter: Payer: Self-pay | Admitting: Internal Medicine

## 2013-03-08 VITALS — BP 128/82 | HR 71 | Temp 97.5°F | Resp 16 | Ht 65.0 in | Wt 137.0 lb

## 2013-03-08 DIAGNOSIS — G4700 Insomnia, unspecified: Secondary | ICD-10-CM

## 2013-03-08 DIAGNOSIS — E785 Hyperlipidemia, unspecified: Secondary | ICD-10-CM

## 2013-03-08 DIAGNOSIS — Z2911 Encounter for prophylactic immunotherapy for respiratory syncytial virus (RSV): Secondary | ICD-10-CM

## 2013-03-08 DIAGNOSIS — Z0000 Encounter for general adult medical examination without abnormal findings: Secondary | ICD-10-CM

## 2013-03-08 DIAGNOSIS — Z23 Encounter for immunization: Secondary | ICD-10-CM

## 2013-03-08 DIAGNOSIS — I10 Essential (primary) hypertension: Secondary | ICD-10-CM

## 2013-03-08 DIAGNOSIS — Z1231 Encounter for screening mammogram for malignant neoplasm of breast: Secondary | ICD-10-CM | POA: Insufficient documentation

## 2013-03-08 LAB — COMPREHENSIVE METABOLIC PANEL
ALBUMIN: 4.1 g/dL (ref 3.5–5.2)
ALK PHOS: 42 U/L (ref 39–117)
ALT: 25 U/L (ref 0–35)
AST: 28 U/L (ref 0–37)
BUN: 17 mg/dL (ref 6–23)
CO2: 28 mEq/L (ref 19–32)
Calcium: 9.3 mg/dL (ref 8.4–10.5)
Chloride: 104 mEq/L (ref 96–112)
Creatinine, Ser: 0.8 mg/dL (ref 0.4–1.2)
GFR: 79.94 mL/min (ref >60.00)
Glucose, Bld: 101 mg/dL — ABNORMAL HIGH (ref 70–99)
POTASSIUM: 3.6 meq/L (ref 3.5–5.1)
SODIUM: 140 meq/L (ref 135–145)
TOTAL PROTEIN: 7.9 g/dL (ref 6.0–8.3)
Total Bilirubin: 0.6 mg/dL (ref 0.3–1.2)

## 2013-03-08 LAB — TSH: TSH: 1.68 u[IU]/mL (ref 0.35–5.50)

## 2013-03-08 LAB — LDL CHOLESTEROL, DIRECT: Direct LDL: 145.7 mg/dL

## 2013-03-08 LAB — LIPID PANEL
CHOL/HDL RATIO: 5
CHOLESTEROL: 232 mg/dL — AB (ref 0–200)
HDL: 48.1 mg/dL (ref >39.00)
Triglycerides: 206 mg/dL — ABNORMAL HIGH (ref 0.0–149.0)
VLDL: 41.2 mg/dL — AB (ref 0.0–40.0)

## 2013-03-08 MED ORDER — ATORVASTATIN CALCIUM 80 MG PO TABS: 80.0000 mg | ORAL_TABLET | Freq: Every day | ORAL | Status: DC

## 2013-03-08 MED ORDER — LISINOPRIL 10 MG PO TABS: 10.0000 mg | ORAL_TABLET | Freq: Every day | ORAL | Status: DC

## 2013-03-08 MED ORDER — DOXEPIN HCL 6 MG PO TABS: 1.0000 | ORAL_TABLET | Freq: Every evening | ORAL | Status: DC | PRN

## 2013-03-08 NOTE — Assessment & Plan Note (Signed)
Change crestor to lipitor at her request Check the [facility] TSH and CMP today

## 2013-03-08 NOTE — Assessment & Plan Note (Signed)
She will try silenor for the insomnia

## 2013-03-08 NOTE — Assessment & Plan Note (Signed)
Will d/c tribenzor due to the cost Will stay in the [facility] for now Today, I will check her lytes and renal function

## 2013-03-08 NOTE — Progress Notes (Signed)
Pre visit review using our clinic review tool, if applicable. No additional management support is needed unless otherwise documented below in the visit note. 

## 2013-03-08 NOTE — Assessment & Plan Note (Addendum)

## 2013-03-08 NOTE — Patient Instructions (Signed)
Hypertension As your heart beats, it forces blood through your arteries. This force is your blood pressure. If the pressure is too high, it is called hypertension (HTN) or high blood pressure. HTN is dangerous because you may have it and not know it. High blood pressure may mean that your heart has to work harder to pump blood. Your arteries may be narrow or stiff. The extra work puts you at risk for heart disease, stroke, and other problems.  Blood pressure consists of two numbers, a higher number over a lower, 110/72, for example. It is stated as "110 over 72." The ideal is below 120 for the top number (systolic) and under 80 for the bottom (diastolic). Write down your blood pressure today. You should pay close attention to your blood pressure if you have certain conditions such as:  Heart failure.  Prior heart attack.  Diabetes  Chronic kidney disease.  Prior stroke.  Multiple risk factors for heart disease. To see if you have HTN, your blood pressure should be measured while you are seated with your arm held at the level of the heart. It should be measured at least twice. A one-time elevated blood pressure reading (especially in the Emergency Department) does not mean that you need treatment. There may be conditions in which the blood pressure is different between your right and left arms. It is important to see your caregiver soon for a recheck. Most people have essential hypertension which means that there is not a specific cause. This type of high blood pressure may be lowered by changing lifestyle factors such as:  Stress.  Smoking.  Lack of exercise.  Excessive weight.  Drug/tobacco/alcohol use.  Eating less salt. Most people do not have symptoms from high blood pressure until it has caused damage to the body. Effective treatment can often prevent, delay or reduce that damage. TREATMENT  When a cause has been identified, treatment for high blood pressure is directed at the  cause. There are a large number of medications to treat HTN. These fall into several categories, and your caregiver will help you select the medicines that are best for you. Medications may have side effects. You should review side effects with your caregiver. If your blood pressure stays high after you have made lifestyle changes or started on medicines,   Your medication(s) may need to be changed.  Other problems may need to be addressed.  Be certain you understand your prescriptions, and know how and when to take your medicine.  Be sure to follow up with your caregiver within the time frame advised (usually within two weeks) to have your blood pressure rechecked and to review your medications.  If you are taking more than one medicine to lower your blood pressure, make sure you know how and at what times they should be taken. Taking two medicines at the same time can result in blood pressure that is too low. SEEK IMMEDIATE MEDICAL CARE IF:  You develop a severe headache, blurred or changing vision, or confusion.  You have unusual weakness or numbness, or a faint feeling.  You have severe chest or abdominal pain, vomiting, or breathing problems. MAKE SURE YOU:   Understand these instructions.  Will watch your condition.  Will get help right away if you are not doing well or get worse. Document Released: 12/31/2004 Document Revised: 03/25/2011 Document Reviewed: 08/21/2007 [facility] Patient Information 2014 [place]. Preventive Care for Adults, Female A healthy lifestyle and preventive care can promote health and wellness.  Preventive health guidelines for women include the following key practices.  A routine yearly physical is a good way to check with your health care provider about your health and preventive screening. It is a chance to share any concerns and updates on your health and to receive a thorough exam.  Visit your dentist for a routine exam and preventive care  every 6 months. Brush your teeth twice a day and floss once a day. Good oral hygiene prevents tooth decay and gum disease.  The frequency of eye exams is based on your age, health, family medical history, use of contact lenses, and other factors. Follow your health care provider's recommendations for frequency of eye exams.  Eat a healthy diet. Foods like vegetables, fruits, whole grains, low-fat dairy products, and lean protein foods contain the nutrients you need without too many calories. Decrease your intake of foods high in solid fats, added sugars, and salt. Eat the right amount of calories for you.Get information about a proper diet from your health care provider, if necessary.  Regular physical exercise is one of the most important things you can do for your health. Most adults should get at least 150 minutes of moderate-intensity exercise (any activity that increases your heart rate and causes you to sweat) each week. In addition, most adults need muscle-strengthening exercises on 2 or more days a week.  Maintain a healthy weight. The body mass index (BMI) is a screening tool to identify possible weight problems. It provides an estimate of body fat based on height and weight. Your health care provider can find your BMI, and can help you achieve or maintain a healthy weight.For adults 20 years and older:  A BMI below 18.5 is considered underweight.  A BMI of 18.5 to 24.9 is normal.  A BMI of 25 to 29.9 is considered overweight.  A BMI of 30 and above is considered obese.  Maintain normal blood lipids and cholesterol levels by exercising and minimizing your intake of saturated fat. Eat a balanced diet with plenty of fruit and vegetables. Blood tests for lipids and cholesterol should begin at age 55 and be repeated every 5 years. If your lipid or cholesterol levels are high, you are over 50, or you are at high risk for heart disease, you may need your cholesterol levels checked more  frequently.Ongoing high lipid and cholesterol levels should be treated with medicines if diet and exercise are not working.  If you smoke, find out from your health care provider how to quit. If you do not use tobacco, do not start.  Lung cancer screening is recommended for adults aged 37 80 years who are at high risk for developing lung cancer because of a history of smoking. A yearly low-dose CT scan of the lungs is recommended for people who have at least a 30-pack-year history of smoking and are a current smoker or have quit within the past 15 years. A pack year of smoking is smoking an average of 1 pack of cigarettes a day for 1 year (for example: 1 pack a day for 30 years or 2 packs a day for 15 years). Yearly screening should continue until the smoker has stopped smoking for at least 15 years. Yearly screening should be stopped for people who develop a health problem that would prevent them from having lung cancer treatment.  If you are pregnant, do not drink alcohol. If you are breastfeeding, be very cautious about drinking alcohol. If you are not pregnant and choose  to drink alcohol, do not have more than 1 drink per day. One drink is considered to be 12 ounces (355 mL) of beer, 5 ounces (148 mL) of wine, or 1.5 ounces (44 mL) of liquor.  Avoid use of street drugs. Do not share needles with anyone. Ask for help if you need support or instructions about stopping the use of drugs.  High blood pressure causes heart disease and increases the risk of stroke. Your blood pressure should be checked at least every 1 to 2 years. Ongoing high blood pressure should be treated with medicines if weight loss and exercise do not work.  If you are 74 65 years old, ask your health care provider if you should take aspirin to prevent strokes.  Diabetes screening involves taking a blood sample to check your fasting blood sugar level. This should be done once every 3 years, after age 56, if you are within normal  weight and without risk factors for diabetes. Testing should be considered at a younger age or be carried out more frequently if you are overweight and have at least 1 risk factor for diabetes.  Breast cancer screening is essential preventive care for women. You should practice "breast self-awareness." This means understanding the normal appearance and feel of your breasts and may include breast self-examination. Any changes detected, no matter how small, should be reported to a health care provider. Women in their 81s and 30s should have a clinical breast exam (CBE) by a health care provider as part of a regular health exam every 1 to 3 years. After age 41, women should have a CBE every year. Starting at age 32, women should consider having a mammogram (breast X-ray test) every year. Women who have a family history of breast cancer should talk to their health care provider about genetic screening. Women at a high risk of breast cancer should talk to their health care providers about having an MRI and a mammogram every year.  Breast cancer gene (BRCA)-related cancer risk assessment is recommended for women who have family members with BRCA-related cancers. BRCA-related cancers include breast, ovarian, tubal, and peritoneal cancers. Having family members with these cancers may be associated with an increased risk for harmful changes (mutations) in the breast cancer genes BRCA1 and BRCA2. Results of the assessment will determine the need for genetic counseling and BRCA1 and BRCA2 testing.  The Pap test is a screening test for cervical cancer. A Pap test can show cell changes on the cervix that might become cervical cancer if left untreated. A Pap test is a procedure in which cells are obtained and examined from the lower end of the uterus (cervix).  Women should have a Pap test starting at age 52.  Between ages 46 and 46, Pap tests should be repeated every 2 years.  Beginning at age 26, you should have a  Pap test every 3 years as long as the past 3 Pap tests have been normal.  Some women have medical problems that increase the chance of getting cervical cancer. Talk to your health care provider about these problems. It is especially important to talk to your health care provider if a new problem develops soon after your last Pap test. In these cases, your health care provider may recommend more frequent screening and Pap tests.  The above recommendations are the same for women who have or have not gotten the vaccine for human papillomavirus (HPV).  If you had a hysterectomy for a problem that was  not cancer or a condition that could lead to cancer, then you no longer need Pap tests. Even if you no longer need a Pap test, a regular exam is a good idea to make sure no other problems are starting.  If you are between ages 86 and 40 years, and you have had normal Pap tests going back 10 years, you no longer need Pap tests. Even if you no longer need a Pap test, a regular exam is a good idea to make sure no other problems are starting.  If you have had past treatment for cervical cancer or a condition that could lead to cancer, you need Pap tests and screening for cancer for at least 20 years after your treatment.  If Pap tests have been discontinued, risk factors (such as a new sexual partner) need to be reassessed to determine if screening should be resumed.  The HPV test is an additional test that may be used for cervical cancer screening. The HPV test looks for the virus that can cause the cell changes on the cervix. The cells collected during the Pap test can be tested for HPV. The HPV test could be used to screen women aged 39 years and older, and should be used in women of any age who have unclear Pap test results. After the age of 12, women should have HPV testing at the same frequency as a Pap test.  Colorectal cancer can be detected and often prevented. Most routine colorectal cancer screening  begins at the age of 32 years and continues through age 26 years. However, your health care provider may recommend screening at an earlier age if you have risk factors for colon cancer. On a yearly basis, your health care provider may provide home test kits to check for hidden blood in the stool. Use of a small camera at the end of a tube, to directly examine the colon (sigmoidoscopy or colonoscopy), can detect the earliest forms of colorectal cancer. Talk to your health care provider about this at age 47, when routine screening begins. Direct exam of the colon should be repeated every 5 10 years through age 95 years, unless early forms of pre-cancerous polyps or small growths are found.  People who are at an increased risk for hepatitis B should be screened for this virus. You are considered at high risk for hepatitis B if:  You were born in a country where hepatitis B occurs often. Talk with your health care provider about which countries are considered high risk.  Your parents were born in a high-risk country and you have not received a shot to protect against hepatitis B (hepatitis B vaccine).  You have HIV or AIDS.  You use needles to inject street drugs.  You live with, or have sex with, someone who has Hepatitis B.  You get hemodialysis treatment.  You take certain medicines for conditions like cancer, organ transplantation, and autoimmune conditions.  Hepatitis C blood testing is recommended for all people born from [birth year] through 1967 and any individual with known risks for hepatitis C.  Practice safe sex. Use condoms and avoid high-risk sexual practices to reduce the spread of sexually transmitted infections (STIs). STIs include gonorrhea, chlamydia, syphilis, trichomonas, herpes, HPV, and human immunodeficiency virus (HIV). Herpes, HIV, and HPV are viral illnesses that have no cure. They can result in disability, cancer, and death. Sexually active women aged 67 years and younger should  be checked for chlamydia. Older women with new or multiple partners  should also be tested for chlamydia. Testing for other STIs is recommended if you are sexually active and at increased risk.  Osteoporosis is a disease in which the bones lose minerals and strength with aging. This can result in serious bone fractures or breaks. The risk of osteoporosis can be identified using a bone density scan. Women ages 65 years and over and women at risk for fractures or osteoporosis should discuss screening with their health care providers. Ask your health care provider whether you should take a calcium supplement or vitamin D to reduce the rate of osteoporosis.  Menopause can be associated with physical symptoms and risks. Hormone replacement therapy is available to decrease symptoms and risks. You should talk to your health care provider about whether hormone replacement therapy is right for you.  Use sunscreen. Apply sunscreen liberally and repeatedly throughout the day. You should seek shade when your shadow is shorter than you. Protect yourself by wearing long sleeves, pants, a wide-brimmed hat, and sunglasses year round, whenever you are outdoors.  Once a month, do a whole body skin exam, using a mirror to look at the skin on your back. Tell your health care provider of new moles, moles that have irregular borders, moles that are larger than a pencil eraser, or moles that have changed in shape or color.  Stay current with required vaccines (immunizations).  Influenza vaccine. All adults should be immunized every year.  Tetanus, diphtheria, and acellular pertussis (Td, Tdap) vaccine. Pregnant women should receive 1 dose of Tdap vaccine during each pregnancy. The dose should be obtained regardless of the length of time since the last dose. Immunization is preferred during the 27th 36th week of gestation. An adult who has not previously received Tdap or who does not know her vaccine status should receive 1  dose of Tdap. This initial dose should be followed by tetanus and diphtheria toxoids (Td) booster doses every 10 years. Adults with an unknown or incomplete history of completing a 3-dose immunization series with Td-containing vaccines should begin or complete a primary immunization series including a Tdap dose. Adults should receive a Td booster every 10 years.  Varicella vaccine. An adult without evidence of immunity to varicella should receive 2 doses or a second dose if she has previously received 1 dose. Pregnant females who do not have evidence of immunity should receive the first dose after pregnancy. This first dose should be obtained before leaving the health care facility. The second dose should be obtained 4 8 weeks after the first dose.  Human papillomavirus (HPV) vaccine. Females aged 15 26 years who have not received the vaccine previously should obtain the 3-dose series. The vaccine is not recommended for use in pregnant females. However, pregnancy testing is not needed before receiving a dose. If a female is found to be pregnant after receiving a dose, no treatment is needed. In that case, the remaining doses should be delayed until after the pregnancy. Immunization is recommended for any person with an immunocompromised condition through the age of 79 years if she did not get any or all doses earlier. During the 3-dose series, the second dose should be obtained 4 8 weeks after the first dose. The third dose should be obtained 24 weeks after the first dose and 16 weeks after the second dose.  Zoster vaccine. One dose is recommended for adults aged 70 years or older unless certain conditions are present.  Measles, mumps, and rubella (MMR) vaccine. Adults born before [birth year] generally are  considered immune to measles and mumps. Adults born in [birth year] or later should have 1 or more doses of MMR vaccine unless there is a contraindication to the vaccine or there is laboratory evidence of immunity to  each of the three diseases. A routine second dose of MMR vaccine should be obtained at least 28 days after the first dose for students attending postsecondary schools, health care workers, or international travelers. People who received inactivated measles vaccine or an unknown type of measles vaccine during 1963 1967 should receive 2 doses of MMR vaccine. People who received inactivated mumps vaccine or an unknown type of mumps vaccine before 1979 and are at high risk for mumps infection should consider immunization with 2 doses of MMR vaccine. For females of childbearing age, rubella immunity should be determined. If there is no evidence of immunity, females who are not pregnant should be vaccinated. If there is no evidence of immunity, females who are pregnant should delay immunization until after pregnancy. Unvaccinated health care workers born before [birth year] who lack laboratory evidence of measles, mumps, or rubella immunity or laboratory confirmation of disease should consider measles and mumps immunization with 2 doses of MMR vaccine or rubella immunization with 1 dose of MMR vaccine.  Pneumococcal 13-valent conjugate (PCV13) vaccine. When indicated, a person who is uncertain of her immunization history and has no record of immunization should receive the PCV13 vaccine. An adult aged 58 years or older who has certain medical conditions and has not been previously immunized should receive 1 dose of PCV13 vaccine. This PCV13 should be followed with a dose of pneumococcal polysaccharide (PPSV23) vaccine. The PPSV23 vaccine dose should be obtained at least 8 weeks after the dose of PCV13 vaccine. An adult aged 4 years or older who has certain medical conditions and previously received 1 or more doses of PPSV23 vaccine should receive 1 dose of PCV13. The PCV13 vaccine dose should be obtained 1 or more years after the last PPSV23 vaccine dose.  Pneumococcal polysaccharide (PPSV23) vaccine. When PCV13 is also  indicated, PCV13 should be obtained first. All adults aged 32 years and older should be immunized. An adult younger than age 1 years who has certain medical conditions should be immunized. Any person who resides in a nursing home or long-term care facility should be immunized. An adult smoker should be immunized. People with an immunocompromised condition and certain other conditions should receive both PCV13 and PPSV23 vaccines. People with human immunodeficiency virus (HIV) infection should be immunized as soon as possible after diagnosis. Immunization during chemotherapy or radiation therapy should be avoided. Routine use of PPSV23 vaccine is not recommended for American Indians, [place] Natives, or people younger than 65 years unless there are medical conditions that require PPSV23 vaccine. When indicated, people who have unknown immunization and have no record of immunization should receive PPSV23 vaccine. One-time revaccination 5 years after the first dose of PPSV23 is recommended for people aged 85 64 years who have chronic kidney failure, nephrotic syndrome, asplenia, or immunocompromised conditions. People who received 1 2 doses of PPSV23 before age 25 years should receive another dose of PPSV23 vaccine at age 84 years or later if at least 5 years have passed since the previous dose. Doses of PPSV23 are not needed for people immunized with PPSV23 at or after age 61 years.  Meningococcal vaccine. Adults with asplenia or persistent complement component deficiencies should receive 2 doses of quadrivalent meningococcal conjugate (MenACWY-D) vaccine. The doses should be obtained at least 2 months  apart. Microbiologists working with certain meningococcal bacteria, [employer] recruits, people at risk during an outbreak, and people who travel to or live in countries with a high rate of meningitis should be immunized. A first-year college student up through age 29 years who is living in a residence hall should  receive a dose if she did not receive a dose on or after her 16th birthday. Adults who have certain high-risk conditions should receive one or more doses of vaccine.  Hepatitis A vaccine. Adults who wish to be protected from this disease, have certain high-risk conditions, work with hepatitis A-infected animals, work in hepatitis A research labs, or travel to or work in countries with a high rate of hepatitis A should be immunized. Adults who were previously unvaccinated and who anticipate close contact with an international adoptee during the first 60 days after arrival in the [place] from a country with a high rate of hepatitis A should be immunized.  Hepatitis B vaccine. Adults who wish to be protected from this disease, have certain high-risk conditions, may be exposed to blood or other infectious body fluids, are household contacts or sex partners of hepatitis B positive people, are clients or workers in certain care facilities, or travel to or work in countries with a high rate of hepatitis B should be immunized.  Haemophilus influenzae type b (Hib) vaccine. A previously unvaccinated person with asplenia or sickle cell disease or having a scheduled splenectomy should receive 1 dose of Hib vaccine. Regardless of previous immunization, a recipient of a hematopoietic stem cell transplant should receive a 3-dose series 6 12 months after her successful transplant. Hib vaccine is not recommended for adults with HIV infection. Preventive Services / Frequency Ages 98 to 39years  Blood pressure check.** / Every 1 to 2 years.  Lipid and cholesterol check.** / Every 5 years beginning at age 41.  Clinical breast exam.** / Every 3 years for women in their 39s and 3s.  BRCA-related cancer risk assessment.** / For women who have family members with a BRCA-related cancer (breast, ovarian, tubal, or peritoneal cancers).  Pap test.** / Every 2 years from ages 31 through 17. Every 3 years starting at age  72 through age 8 or 49 with a history of 3 consecutive normal Pap tests.  HPV screening.** / Every 3 years from ages 51 through ages 40 to 32 with a history of 3 consecutive normal Pap tests.  Hepatitis C blood test.** / For any individual with known risks for hepatitis C.  Skin self-exam. / Monthly.  Influenza vaccine. / Every year.  Tetanus, diphtheria, and acellular pertussis (Tdap, Td) vaccine.** / Consult your health care provider. Pregnant women should receive 1 dose of Tdap vaccine during each pregnancy. 1 dose of Td every 10 years.  Varicella vaccine.** / Consult your health care provider. Pregnant females who do not have evidence of immunity should receive the first dose after pregnancy.  HPV vaccine. / 3 doses over 6 months, if 43 and younger. The vaccine is not recommended for use in pregnant females. However, pregnancy testing is not needed before receiving a dose.  Measles, mumps, rubella (MMR) vaccine.** / You need at least 1 dose of MMR if you were born in 1957 or later. You may also need a 2nd dose. For females of childbearing age, rubella immunity should be determined. If there is no evidence of immunity, females who are not pregnant should be vaccinated. If there is no evidence of immunity, females who are pregnant  should delay immunization until after pregnancy.  Pneumococcal 13-valent conjugate (PCV13) vaccine.** / Consult your health care provider.  Pneumococcal polysaccharide (PPSV23) vaccine.** / 1 to 2 doses if you smoke cigarettes or if you have certain conditions.  Meningococcal vaccine.** / 1 dose if you are age 74 to 79 years and a Market researcher living in a residence hall, or have one of several medical conditions, you need to get vaccinated against meningococcal disease. You may also need additional booster doses.  Hepatitis A vaccine.** / Consult your health care provider.  Hepatitis B vaccine.** / Consult your health care provider.  Haemophilus  influenzae type b (Hib) vaccine.** / Consult your health care provider. Ages 32 to 64years  Blood pressure check.** / Every 1 to 2 years.  Lipid and cholesterol check.** / Every 5 years beginning at age 64 years.  Lung cancer screening. / Every year if you are aged 94 80 years and have a 30-pack-year history of smoking and currently smoke or have quit within the past 15 years. Yearly screening is stopped once you have quit smoking for at least 15 years or develop a health problem that would prevent you from having lung cancer treatment.  Clinical breast exam.** / Every year after age 79 years.  BRCA-related cancer risk assessment.** / For women who have family members with a BRCA-related cancer (breast, ovarian, tubal, or peritoneal cancers).  Mammogram.** / Every year beginning at age 42 years and continuing for as long as you are in good health. Consult with your health care provider.  Pap test.** / Every 3 years starting at age 30 years through age 64 or 23 years with a history of 3 consecutive normal Pap tests.  HPV screening.** / Every 3 years from ages 2 years through ages 64 to 61 years with a history of 3 consecutive normal Pap tests.  Fecal occult blood test (FOBT) of stool. / Every year beginning at age 54 years and continuing until age 66 years. You may not need to do this test if you get a colonoscopy every 10 years.  Flexible sigmoidoscopy or colonoscopy.** / Every 5 years for a flexible sigmoidoscopy or every 10 years for a colonoscopy beginning at age 59 years and continuing until age 32 years.  Hepatitis C blood test.** / For all people born from [birth year] through 1965 and any individual with known risks for hepatitis C.  Skin self-exam. / Monthly.  Influenza vaccine. / Every year.  Tetanus, diphtheria, and acellular pertussis (Tdap/Td) vaccine.** / Consult your health care provider. Pregnant women should receive 1 dose of Tdap vaccine during each pregnancy. 1 dose of Td  every 10 years.  Varicella vaccine.** / Consult your health care provider. Pregnant females who do not have evidence of immunity should receive the first dose after pregnancy.  Zoster vaccine.** / 1 dose for adults aged 59 years or older.  Measles, mumps, rubella (MMR) vaccine.** / You need at least 1 dose of MMR if you were born in 1957 or later. You may also need a 2nd dose. For females of childbearing age, rubella immunity should be determined. If there is no evidence of immunity, females who are not pregnant should be vaccinated. If there is no evidence of immunity, females who are pregnant should delay immunization until after pregnancy.  Pneumococcal 13-valent conjugate (PCV13) vaccine.** / Consult your health care provider.  Pneumococcal polysaccharide (PPSV23) vaccine.** / 1 to 2 doses if you smoke cigarettes or if you have certain conditions.  Meningococcal  vaccine.** / Consult your health care provider.  Hepatitis A vaccine.** / Consult your health care provider.  Hepatitis B vaccine.** / Consult your health care provider.  Haemophilus influenzae type b (Hib) vaccine.** / Consult your health care provider. Ages 83 years and over  Blood pressure check.** / Every 1 to 2 years.  Lipid and cholesterol check.** / Every 5 years beginning at age 49 years.  Lung cancer screening. / Every year if you are aged 34 80 years and have a 30-pack-year history of smoking and currently smoke or have quit within the past 15 years. Yearly screening is stopped once you have quit smoking for at least 15 years or develop a health problem that would prevent you from having lung cancer treatment.  Clinical breast exam.** / Every year after age 38 years.  BRCA-related cancer risk assessment.** / For women who have family members with a BRCA-related cancer (breast, ovarian, tubal, or peritoneal cancers).  Mammogram.** / Every year beginning at age 46 years and continuing for as long as you are in good  health. Consult with your health care provider.  Pap test.** / Every 3 years starting at age 74 years through age 34 or 64 years with 3 consecutive normal Pap tests. Testing can be stopped between 65 and 70 years with 3 consecutive normal Pap tests and no abnormal Pap or HPV tests in the past 10 years.  HPV screening.** / Every 3 years from ages 66 years through ages 60 or 41 years with a history of 3 consecutive normal Pap tests. Testing can be stopped between 65 and 70 years with 3 consecutive normal Pap tests and no abnormal Pap or HPV tests in the past 10 years.  Fecal occult blood test (FOBT) of stool. / Every year beginning at age 40 years and continuing until age 76 years. You may not need to do this test if you get a colonoscopy every 10 years.  Flexible sigmoidoscopy or colonoscopy.** / Every 5 years for a flexible sigmoidoscopy or every 10 years for a colonoscopy beginning at age 50 years and continuing until age 64 years.  Hepatitis C blood test.** / For all people born from [birth year] through 1965 and any individual with known risks for hepatitis C.  Osteoporosis screening.** / A one-time screening for women ages 50 years and over and women at risk for fractures or osteoporosis.  Skin self-exam. / Monthly.  Influenza vaccine. / Every year.  Tetanus, diphtheria, and acellular pertussis (Tdap/Td) vaccine.** / 1 dose of Td every 10 years.  Varicella vaccine.** / Consult your health care provider.  Zoster vaccine.** / 1 dose for adults aged 29 years or older.  Pneumococcal 13-valent conjugate (PCV13) vaccine.** / Consult your health care provider.  Pneumococcal polysaccharide (PPSV23) vaccine.** / 1 dose for all adults aged 55 years and older.  Meningococcal vaccine.** / Consult your health care provider.  Hepatitis A vaccine.** / Consult your health care provider.  Hepatitis B vaccine.** / Consult your health care provider.  Haemophilus influenzae type b (Hib) vaccine.** /  Consult your health care provider. ** Family history and personal history of risk and conditions may change your health care provider's recommendations. Document Released: 02/26/2001 Document Revised: 10/21/2012 Document Reviewed: 05/28/2010 [facility] Patient Information 2014 [place], [place].

## 2013-03-08 NOTE — Progress Notes (Signed)
Subjective:    Patient ID: Katrina FudgeVinh Baird, female    DOB: [birth year], 63 y.o.   MRN: [id number]  Hypertension This is a chronic problem. The problem has been gradually improving since onset. The problem is controlled. Pertinent negatives include no anxiety, blurred vision, chest pain, headaches, malaise/fatigue, neck pain, orthopnea, palpitations, peripheral edema, PND, shortness of breath or sweats. There are no associated agents to hypertension. Past treatments include angiotensin blockers (I had prescribed tribenzor but it was too expensive so she has been taking an ACEI ). The current treatment provides significant improvement. Compliance problems include medication cost.       Review of Systems  Constitutional: Negative.  Negative for fever, chills, malaise/fatigue, diaphoresis, appetite change and fatigue.  HENT: Negative.   Eyes: Negative.  Negative for blurred vision.  Respiratory: Negative.  Negative for cough, choking, chest tightness, shortness of breath, wheezing and stridor.   Cardiovascular: Negative.  Negative for chest pain, palpitations, orthopnea, leg swelling and PND.  Gastrointestinal: Negative.  Negative for nausea, vomiting, abdominal pain, diarrhea, constipation and blood in stool.  Endocrine: Negative.   Genitourinary: Negative.   Musculoskeletal: Negative.  Negative for arthralgias, back pain, gait problem, joint swelling, myalgias, neck pain and neck stiffness.  Skin: Negative.   Allergic/Immunologic: Negative.   Neurological: Negative.  Negative for headaches.  Hematological: Negative.  Negative for adenopathy. Does not bruise/bleed easily.  Psychiatric/Behavioral: Positive for sleep disturbance (insomnia with DFA and FA's). Negative for suicidal ideas, hallucinations, behavioral problems, confusion, self-injury, dysphoric mood, decreased concentration and agitation. The patient is not nervous/anxious and is not hyperactive.        Objective:   Physical Exam    Vitals reviewed. Constitutional: She is oriented to person, place, and time. She appears well-developed and well-nourished.  HENT:  Head: Normocephalic and atraumatic.  Mouth/Throat: Oropharynx is clear and moist. No oropharyngeal exudate.  Eyes: Conjunctivae are normal. Right eye exhibits no discharge. Left eye exhibits no discharge. No scleral icterus.  Neck: Normal range of motion. Neck supple. No JVD present. No tracheal deviation present. No thyromegaly present.  Cardiovascular: Normal rate, regular rhythm, normal heart sounds and intact distal pulses.  Exam reveals no gallop and no friction rub.   No murmur heard. Pulmonary/Chest: Effort normal and breath sounds normal. No stridor. No respiratory distress. She has no wheezes. She has no rales. She exhibits no tenderness.  Abdominal: Soft. Bowel sounds are normal. She exhibits no distension and no mass. There is no tenderness. There is no rebound and no guarding.  Musculoskeletal: Normal range of motion. She exhibits no edema and no tenderness.  Lymphadenopathy:    She has no cervical adenopathy.  Neurological: She is oriented to person, place, and time.  Skin: Skin is warm and dry. No rash noted. She is not diaphoretic. No erythema. No pallor.  Psychiatric: She has a normal mood and affect. Her behavior is normal. Judgment and thought content normal.     Lab Results  Component Value Date   WBC 5.0 09/23/2012   HGB 13.4 09/23/2012   HCT 40.2 09/23/2012   PLT 244.0 09/23/2012   GLUCOSE 97 09/23/2012   CHOL 330* 09/23/2012   TRIG 103.0 09/23/2012   HDL 47.00 09/23/2012   LDLDIRECT 261.6 09/23/2012   ALT 11 09/23/2012   AST 16 09/23/2012   NA 139 09/23/2012   K 3.8 09/23/2012   CL 102 09/23/2012   CREATININE 0.7 09/23/2012   BUN 13 09/23/2012   CO2 30 09/23/2012   TSH  0.76 09/23/2012   HGBA1C 4.7 09/23/2012       Assessment & Plan:

## 2013-03-09 ENCOUNTER — Encounter: Payer: Self-pay | Admitting: Internal Medicine

## 2013-05-24 ENCOUNTER — Encounter: Payer: Self-pay | Admitting: Internal Medicine

## 2013-11-08 ENCOUNTER — Other Ambulatory Visit (INDEPENDENT_AMBULATORY_CARE_PROVIDER_SITE_OTHER): Payer: 59

## 2013-11-08 ENCOUNTER — Encounter: Payer: Self-pay | Admitting: Internal Medicine

## 2013-11-08 ENCOUNTER — Ambulatory Visit (INDEPENDENT_AMBULATORY_CARE_PROVIDER_SITE_OTHER): Payer: 59 | Admitting: Internal Medicine

## 2013-11-08 VITALS — BP 148/108 | HR 76 | Temp 98.2°F | Resp 16 | Ht 65.0 in | Wt 137.1 lb

## 2013-11-08 DIAGNOSIS — G4700 Insomnia, unspecified: Secondary | ICD-10-CM

## 2013-11-08 DIAGNOSIS — I10 Essential (primary) hypertension: Secondary | ICD-10-CM

## 2013-11-08 DIAGNOSIS — Z23 Encounter for immunization: Secondary | ICD-10-CM

## 2013-11-08 DIAGNOSIS — E785 Hyperlipidemia, unspecified: Secondary | ICD-10-CM

## 2013-11-08 LAB — BASIC METABOLIC PANEL
BUN: 17 mg/dL (ref 6–23)
CHLORIDE: 104 meq/L (ref 96–112)
CO2: 26 mEq/L (ref 19–32)
Calcium: 9.1 mg/dL (ref 8.4–10.5)
Creatinine, Ser: 0.7 mg/dL (ref 0.4–1.2)
GFR: 84.84 mL/min (ref >60.00)
GLUCOSE: 96 mg/dL (ref 70–99)
Potassium: 3.6 mEq/L (ref 3.5–5.1)
Sodium: 140 mEq/L (ref 135–145)

## 2013-11-08 LAB — URINALYSIS, ROUTINE W REFLEX MICROSCOPIC
Bilirubin Urine: NEGATIVE
Hgb urine dipstick: NEGATIVE
KETONES UR: NEGATIVE
Leukocytes, UA: NEGATIVE
Nitrite: NEGATIVE
PH: 6 (ref 5.0–8.0)
RBC / HPF: NONE SEEN (ref 0–?)
Total Protein, Urine: NEGATIVE
URINE GLUCOSE: NEGATIVE
Urobilinogen, UA: 0.2 (ref 0.0–1.0)

## 2013-11-08 LAB — LIPID PANEL
CHOLESTEROL: 309 mg/dL — AB (ref 0–200)
HDL: 42.4 mg/dL (ref >39.00)
LDL CALC: 227 mg/dL — AB (ref 0–99)
NonHDL: 266.6
TRIGLYCERIDES: 198 mg/dL — AB (ref 0.0–149.0)
Total CHOL/HDL Ratio: 7
VLDL: 39.6 mg/dL (ref 0.0–40.0)

## 2013-11-08 LAB — TSH: TSH: 1.4 u[IU]/mL (ref 0.35–4.50)

## 2013-11-08 MED ORDER — ATORVASTATIN CALCIUM 80 MG PO TABS: 80.0000 mg | ORAL_TABLET | Freq: Every day | ORAL | Status: DC

## 2013-11-08 MED ORDER — SCOPOLAMINE 1 MG/3DAYS TD PT72: 1.0000 | MEDICATED_PATCH | TRANSDERMAL | Status: DC

## 2013-11-08 MED ORDER — LOSARTAN POTASSIUM-HCTZ 100-12.5 MG PO TABS: 1.0000 | ORAL_TABLET | Freq: Every day | ORAL | Status: DC

## 2013-11-08 MED ORDER — DOXEPIN HCL 6 MG PO TABS: 1.0000 | ORAL_TABLET | Freq: Every evening | ORAL | Status: DC | PRN

## 2013-11-08 MED ORDER — EZETIMIBE 10 MG PO TABS: 10.0000 mg | ORAL_TABLET | Freq: Every day | ORAL | Status: DC

## 2013-11-08 NOTE — Assessment & Plan Note (Signed)
Her BP is not well controlled on the ACEI alone Will change to an ARB/HCTZ combo for better control Will check her labs to screen for secondary causes of HTN and to look for end organ damage

## 2013-11-08 NOTE — Assessment & Plan Note (Signed)
I will recheck her FLP and see if she has met her LDL goal 

## 2013-11-08 NOTE — Progress Notes (Signed)
   Subjective:    Patient ID: Katrina Baird, female    DOB: 09/01/1950, 63 y.o.   MRN: [id number]  Hypertension This is a chronic problem. The current episode started more than 1 year ago. The problem has been gradually worsening since onset. The problem is uncontrolled. Associated symptoms include headaches. Pertinent negatives include no anxiety, blurred vision, chest pain, malaise/fatigue, neck pain, orthopnea, palpitations, peripheral edema, PND, shortness of breath or sweats. Past treatments include ACE inhibitors. The current treatment provides mild improvement. Compliance problems include diet and exercise.       Review of Systems  Constitutional: Negative.  Negative for fever, chills, malaise/fatigue, diaphoresis, appetite change and fatigue.  Eyes: Negative.  Negative for blurred vision.  Respiratory: Negative.  Negative for cough, choking, shortness of breath and stridor.   Cardiovascular: Negative.  Negative for chest pain, palpitations, orthopnea, leg swelling and PND.  Gastrointestinal: Negative.  Negative for nausea, vomiting, abdominal pain, diarrhea, constipation and blood in stool.  Endocrine: Negative.   Genitourinary: Negative.   Musculoskeletal: Negative.  Negative for arthralgias, back pain, gait problem, joint swelling, myalgias and neck pain.  Skin: Negative.  Negative for rash.  Allergic/Immunologic: Negative.   Neurological: Positive for dizziness and headaches. Negative for tremors, seizures, syncope, facial asymmetry, speech difficulty, weakness, light-headedness and numbness.  Hematological: Negative.  Negative for adenopathy. Does not bruise/bleed easily.  Psychiatric/Behavioral: Negative.        Objective:   Physical Exam  Vitals reviewed. Constitutional: She is oriented to person, place, and time. She appears well-developed and well-nourished. No distress.  HENT:  Head: Normocephalic and atraumatic.  Mouth/Throat: Oropharynx is clear and moist. No  oropharyngeal exudate.  Eyes: Conjunctivae are normal. Right eye exhibits no discharge. Left eye exhibits no discharge. No scleral icterus.  Neck: Normal range of motion. Neck supple. No JVD present. No tracheal deviation present. No thyromegaly present.  Cardiovascular: Normal rate, regular rhythm, normal heart sounds and intact distal pulses.  Exam reveals no gallop and no friction rub.   No murmur heard. Pulmonary/Chest: Effort normal and breath sounds normal. No stridor. No respiratory distress. She has no wheezes. She has no rales. She exhibits no tenderness.  Abdominal: Soft. Bowel sounds are normal. She exhibits no distension and no mass. There is no tenderness. There is no rebound and no guarding.  Musculoskeletal: Normal range of motion. She exhibits no edema and no tenderness.  Lymphadenopathy:    She has no cervical adenopathy.  Neurological: She is oriented to person, place, and time.  Skin: Skin is warm and dry. No rash noted. She is not diaphoretic. No erythema. No pallor.  Psychiatric: She has a normal mood and affect. Her behavior is normal. Judgment and thought content normal.     Lab Results  Component Value Date   WBC 5.0 09/23/2012   HGB 13.4 09/23/2012   HCT 40.2 09/23/2012   PLT 244.0 09/23/2012   GLUCOSE 101* 03/08/2013   CHOL 232* 03/08/2013   TRIG 206.0* 03/08/2013   HDL 48.10 03/08/2013   LDLDIRECT 145.7 03/08/2013   ALT 25 03/08/2013   AST 28 03/08/2013   NA 140 03/08/2013   K 3.6 03/08/2013   CL 104 03/08/2013   CREATININE 0.8 03/08/2013   BUN 17 03/08/2013   CO2 28 03/08/2013   TSH 1.68 03/08/2013   HGBA1C 4.7 09/23/2012       Assessment & Plan:

## 2013-11-08 NOTE — Patient Instructions (Signed)

## 2013-11-08 NOTE — Progress Notes (Signed)
Pre visit review using our clinic review tool, if applicable. No additional management support is needed unless otherwise documented below in the visit note. 

## 2013-11-09 ENCOUNTER — Telehealth: Payer: Self-pay | Admitting: Internal Medicine

## 2013-11-09 NOTE — Telephone Encounter (Signed)
emmi emailed °

## 2014-03-30 ENCOUNTER — Encounter: Payer: Self-pay | Admitting: Internal Medicine

## 2014-03-30 ENCOUNTER — Other Ambulatory Visit (INDEPENDENT_AMBULATORY_CARE_PROVIDER_SITE_OTHER): Payer: 59

## 2014-03-30 ENCOUNTER — Ambulatory Visit (INDEPENDENT_AMBULATORY_CARE_PROVIDER_SITE_OTHER): Payer: 59 | Admitting: Internal Medicine

## 2014-03-30 VITALS — BP 122/86 | HR 64 | Temp 98.1°F | Resp 16 | Ht 65.0 in | Wt 133.0 lb

## 2014-03-30 DIAGNOSIS — Z1211 Encounter for screening for malignant neoplasm of colon: Secondary | ICD-10-CM | POA: Insufficient documentation

## 2014-03-30 DIAGNOSIS — I10 Essential (primary) hypertension: Secondary | ICD-10-CM

## 2014-03-30 DIAGNOSIS — E785 Hyperlipidemia, unspecified: Secondary | ICD-10-CM

## 2014-03-30 DIAGNOSIS — Z0000 Encounter for general adult medical examination without abnormal findings: Secondary | ICD-10-CM

## 2014-03-30 DIAGNOSIS — Z1231 Encounter for screening mammogram for malignant neoplasm of breast: Secondary | ICD-10-CM

## 2014-03-30 DIAGNOSIS — Z23 Encounter for immunization: Secondary | ICD-10-CM

## 2014-03-30 LAB — COMPREHENSIVE METABOLIC PANEL
ALBUMIN: 4.4 g/dL (ref 3.5–5.2)
ALT: 24 U/L (ref 0–35)
AST: 19 U/L (ref 0–37)
Alkaline Phosphatase: 75 U/L (ref 39–117)
BUN: 14 mg/dL (ref 6–23)
CO2: 31 mEq/L (ref 19–32)
Calcium: 9.4 mg/dL (ref 8.4–10.5)
Chloride: 103 mEq/L (ref 96–112)
Creatinine, Ser: 0.74 mg/dL (ref 0.40–1.20)
GFR: 83.42 mL/min (ref >60.00)
GLUCOSE: 99 mg/dL (ref 70–99)
Potassium: 3.8 mEq/L (ref 3.5–5.1)
Sodium: 141 mEq/L (ref 135–145)
Total Bilirubin: 0.7 mg/dL (ref 0.2–1.2)
Total Protein: 7.8 g/dL (ref 6.0–8.3)

## 2014-03-30 LAB — LIPID PANEL
Cholesterol: 175 mg/dL (ref 0–200)
HDL: 49.5 mg/dL (ref >39.00)
LDL Cholesterol: 102 mg/dL — ABNORMAL HIGH (ref 0–99)
NONHDL: 125.5
Total CHOL/HDL Ratio: 4
Triglycerides: 118 mg/dL (ref 0.0–149.0)
VLDL: 23.6 mg/dL (ref 0.0–40.0)

## 2014-03-30 LAB — TSH: TSH: 0.81 u[IU]/mL (ref 0.35–4.50)

## 2014-03-30 MED ORDER — EZETIMIBE 10 MG PO TABS: 10.0000 mg | ORAL_TABLET | Freq: Every day | ORAL | Status: DC

## 2014-03-30 MED ORDER — LOSARTAN POTASSIUM-HCTZ 100-12.5 MG PO TABS: 1.0000 | ORAL_TABLET | Freq: Every day | ORAL | Status: DC

## 2014-03-30 MED ORDER — ATORVASTATIN CALCIUM 80 MG PO TABS: 80.0000 mg | ORAL_TABLET | Freq: Every day | ORAL | Status: DC

## 2014-03-30 NOTE — Assessment & Plan Note (Signed)
Her BP is well controlled Will monitor her lytes and renal function 

## 2014-03-30 NOTE — Assessment & Plan Note (Signed)

## 2014-03-30 NOTE — Progress Notes (Signed)
Pre visit review using our clinic review tool, if applicable. No additional management support is needed unless otherwise documented below in the visit note. 

## 2014-03-30 NOTE — Progress Notes (Signed)
   Subjective:    Patient ID: Katrina Baird, female    DOB: 30-Jan-1950, 64 y.o.   MRN: [id number]  Hyperlipidemia This is a chronic problem. The current episode started more than 1 year ago. The problem is uncontrolled. She has no history of chronic renal disease, diabetes, hypothyroidism, liver disease, obesity or nephrotic syndrome. There are no known factors aggravating her hyperlipidemia. Pertinent negatives include no chest pain, focal sensory loss, focal weakness, leg pain, myalgias or shortness of breath. Current antihyperlipidemic treatment includes statins, ezetimibe and exercise. The current treatment provides moderate improvement of lipids. Compliance problems include medication side effects (zetia caused nausea).       Review of Systems  Constitutional: Negative.  Negative for fever, chills, diaphoresis, appetite change and fatigue.  HENT: Negative.   Eyes: Negative.   Respiratory: Negative.  Negative for cough, choking, chest tightness, shortness of breath and stridor.   Cardiovascular: Negative.  Negative for chest pain, palpitations and leg swelling.  Gastrointestinal: Negative.  Negative for nausea, vomiting, abdominal pain, diarrhea, constipation and blood in stool.  Endocrine: Negative.   Genitourinary: Negative.   Musculoskeletal: Negative.  Negative for myalgias, back pain, arthralgias and neck pain.  Skin: Negative.  Negative for rash.  Allergic/Immunologic: Negative.   Neurological: Negative.  Negative for dizziness, focal weakness, syncope, speech difficulty, light-headedness, numbness and headaches.  Hematological: Negative.  Negative for adenopathy. Does not bruise/bleed easily.  Psychiatric/Behavioral: Negative.        Objective:   Physical Exam  Constitutional: She is oriented to person, place, and time. She appears well-developed and well-nourished. No distress.  HENT:  Head: Normocephalic and atraumatic.  Mouth/Throat: Oropharynx is clear and moist. No  oropharyngeal exudate.  Eyes: Conjunctivae are normal. Right eye exhibits no discharge. Left eye exhibits no discharge. No scleral icterus.  Neck: Normal range of motion. Neck supple. No JVD present. No tracheal deviation present. No thyromegaly present.  Cardiovascular: Normal rate, regular rhythm, normal heart sounds and intact distal pulses.  Exam reveals no gallop and no friction rub.   No murmur heard. Pulmonary/Chest: Effort normal and breath sounds normal. No stridor. No respiratory distress. She has no wheezes. She has no rales. She exhibits no tenderness.  Abdominal: Soft. Bowel sounds are normal. She exhibits no distension and no mass. There is no tenderness. There is no rebound and no guarding.  Musculoskeletal: Normal range of motion. She exhibits no edema or tenderness.  Lymphadenopathy:    She has no cervical adenopathy.  Neurological: She is oriented to person, place, and time.  Skin: Skin is warm and dry. No rash noted. She is not diaphoretic. No erythema. No pallor.  Nursing note and vitals reviewed.    Lab Results  Component Value Date   WBC 5.0 09/23/2012   HGB 13.4 09/23/2012   HCT 40.2 09/23/2012   PLT 244.0 09/23/2012   GLUCOSE 96 11/08/2013   CHOL 309* 11/08/2013   TRIG 198.0* 11/08/2013   HDL 42.40 11/08/2013   LDLDIRECT 145.7 03/08/2013   LDLCALC 227* 11/08/2013   ALT 25 03/08/2013   AST 28 03/08/2013   NA 140 11/08/2013   K 3.6 11/08/2013   CL 104 11/08/2013   CREATININE 0.7 11/08/2013   BUN 17 11/08/2013   CO2 26 11/08/2013   TSH 1.40 11/08/2013   HGBA1C 4.7 09/23/2012       Assessment & Plan:

## 2014-03-30 NOTE — Assessment & Plan Note (Signed)
Will stop zetia Will cont the statin FLP today to see if she has achieved her LDL goal

## 2014-03-30 NOTE — Patient Instructions (Signed)
Preventive Care for Adults A healthy lifestyle and preventive care can promote health and wellness. Preventive health guidelines for women include the following key practices.  A routine yearly physical is a good way to check with your health care provider about your health and preventive screening. It is a chance to share any concerns and updates on your health and to receive a thorough exam.  Visit your dentist for a routine exam and preventive care every 6 months. Brush your teeth twice a day and floss once a day. Good oral hygiene prevents tooth decay and gum disease.  The frequency of eye exams is based on your age, health, family medical history, use of contact lenses, and other factors. Follow your health care provider's recommendations for frequency of eye exams.  Eat a healthy diet. Foods like vegetables, fruits, whole grains, low-fat dairy products, and lean protein foods contain the nutrients you need without too many calories. Decrease your intake of foods high in solid fats, added sugars, and salt. Eat the right amount of calories for you.Get information about a proper diet from your health care provider, if necessary.  Regular physical exercise is one of the most important things you can do for your health. Most adults should get at least 150 minutes of moderate-intensity exercise (any activity that increases your heart rate and causes you to sweat) each week. In addition, most adults need muscle-strengthening exercises on 2 or more days a week.  Maintain a healthy weight. The body mass index (BMI) is a screening tool to identify possible weight problems. It provides an estimate of body fat based on height and weight. Your health care provider can find your BMI and can help you achieve or maintain a healthy weight.For adults 20 years and older:  A BMI below 18.5 is considered underweight.  A BMI of 18.5 to 24.9 is normal.  A BMI of 25 to 29.9 is considered overweight.  A BMI of  30 and above is considered obese.  Maintain normal blood lipids and cholesterol levels by exercising and minimizing your intake of saturated fat. Eat a balanced diet with plenty of fruit and vegetables. Blood tests for lipids and cholesterol should begin at age 76 and be repeated every 5 years. If your lipid or cholesterol levels are high, you are over 50, or you are at high risk for heart disease, you may need your cholesterol levels checked more frequently.Ongoing high lipid and cholesterol levels should be treated with medicines if diet and exercise are not working.  If you smoke, find out from your health care provider how to quit. If you do not use tobacco, do not start.  Lung cancer screening is recommended for adults aged 22-80 years who are at high risk for developing lung cancer because of a history of smoking. A yearly low-dose CT scan of the lungs is recommended for people who have at least a 30-pack-year history of smoking and are a current smoker or have quit within the past 15 years. A pack year of smoking is smoking an average of 1 pack of cigarettes a day for 1 year (for example: 1 pack a day for 30 years or 2 packs a day for 15 years). Yearly screening should continue until the smoker has stopped smoking for at least 15 years. Yearly screening should be stopped for people who develop a health problem that would prevent them from having lung cancer treatment.  If you are pregnant, do not drink alcohol. If you are breastfeeding,  be very cautious about drinking alcohol. If you are not pregnant and choose to drink alcohol, do not have more than 1 drink per day. One drink is considered to be 12 ounces (355 mL) of beer, 5 ounces (148 mL) of wine, or 1.5 ounces (44 mL) of liquor.  Avoid use of street drugs. Do not share needles with anyone. Ask for help if you need support or instructions about stopping the use of drugs.  High blood pressure causes heart disease and increases the risk of  stroke. Your blood pressure should be checked at least every 1 to 2 years. Ongoing high blood pressure should be treated with medicines if weight loss and exercise do not work.  If you are 75-52 years old, ask your health care provider if you should take aspirin to prevent strokes.  Diabetes screening involves taking a blood sample to check your fasting blood sugar level. This should be done once every 3 years, after age 15, if you are within normal weight and without risk factors for diabetes. Testing should be considered at a younger age or be carried out more frequently if you are overweight and have at least 1 risk factor for diabetes.  Breast cancer screening is essential preventive care for women. You should practice "breast self-awareness." This means understanding the normal appearance and feel of your breasts and may include breast self-examination. Any changes detected, no matter how small, should be reported to a health care provider. Women in their 58s and 30s should have a clinical breast exam (CBE) by a health care provider as part of a regular health exam every 1 to 3 years. After age 16, women should have a CBE every year. Starting at age 53, women should consider having a mammogram (breast X-ray test) every year. Women who have a family history of breast cancer should talk to their health care provider about genetic screening. Women at a high risk of breast cancer should talk to their health care providers about having an MRI and a mammogram every year.  Breast cancer gene (BRCA)-related cancer risk assessment is recommended for women who have family members with BRCA-related cancers. BRCA-related cancers include breast, ovarian, tubal, and peritoneal cancers. Having family members with these cancers may be associated with an increased risk for harmful changes (mutations) in the breast cancer genes BRCA1 and BRCA2. Results of the assessment will determine the need for genetic counseling and  BRCA1 and BRCA2 testing.  Routine pelvic exams to screen for cancer are no longer recommended for nonpregnant women who are considered low risk for cancer of the pelvic organs (ovaries, uterus, and vagina) and who do not have symptoms. Ask your health care provider if a screening pelvic exam is right for you.  If you have had past treatment for cervical cancer or a condition that could lead to cancer, you need Pap tests and screening for cancer for at least 20 years after your treatment. If Pap tests have been discontinued, your risk factors (such as having a new sexual partner) need to be reassessed to determine if screening should be resumed. Some women have medical problems that increase the chance of getting cervical cancer. In these cases, your health care provider may recommend more frequent screening and Pap tests.  The HPV test is an additional test that may be used for cervical cancer screening. The HPV test looks for the virus that can cause the cell changes on the cervix. The cells collected during the Pap test can be  tested for HPV. The HPV test could be used to screen women aged 30 years and older, and should be used in women of any age who have unclear Pap test results. After the age of 30, women should have HPV testing at the same frequency as a Pap test.  Colorectal cancer can be detected and often prevented. Most routine colorectal cancer screening begins at the age of 50 years and continues through age 75 years. However, your health care provider may recommend screening at an earlier age if you have risk factors for colon cancer. On a yearly basis, your health care provider may provide home test kits to check for hidden blood in the stool. Use of a small camera at the end of a tube, to directly examine the colon (sigmoidoscopy or colonoscopy), can detect the earliest forms of colorectal cancer. Talk to your health care provider about this at age 50, when routine screening begins. Direct  exam of the colon should be repeated every 5-10 years through age 75 years, unless early forms of pre-cancerous polyps or small growths are found.  People who are at an increased risk for hepatitis B should be screened for this virus. You are considered at high risk for hepatitis B if:  You were born in a country where hepatitis B occurs often. Talk with your health care provider about which countries are considered high risk.  Your parents were born in a high-risk country and you have not received a shot to protect against hepatitis B (hepatitis B vaccine).  You have HIV or AIDS.  You use needles to inject street drugs.  You live with, or have sex with, someone who has hepatitis B.  You get hemodialysis treatment.  You take certain medicines for conditions like cancer, organ transplantation, and autoimmune conditions.  Hepatitis C blood testing is recommended for all people born from 1945 through 1965 and any individual with known risks for hepatitis C.  Practice safe sex. Use condoms and avoid high-risk sexual practices to reduce the spread of sexually transmitted infections (STIs). STIs include gonorrhea, chlamydia, syphilis, trichomonas, herpes, HPV, and human immunodeficiency virus (HIV). Herpes, HIV, and HPV are viral illnesses that have no cure. They can result in disability, cancer, and death.  You should be screened for sexually transmitted illnesses (STIs) including gonorrhea and chlamydia if:  You are sexually active and are younger than 24 years.  You are older than 24 years and your health care provider tells you that you are at risk for this type of infection.  Your sexual activity has changed since you were last screened and you are at an increased risk for chlamydia or gonorrhea. Ask your health care provider if you are at risk.  If you are at risk of being infected with HIV, it is recommended that you take a prescription medicine daily to prevent HIV infection. This is  called preexposure prophylaxis (PrEP). You are considered at risk if:  You are a heterosexual woman, are sexually active, and are at increased risk for HIV infection.  You take drugs by injection.  You are sexually active with a partner who has HIV.  Talk with your health care provider about whether you are at high risk of being infected with HIV. If you choose to begin PrEP, you should first be tested for HIV. You should then be tested every 3 months for as long as you are taking PrEP.  Osteoporosis is a disease in which the bones lose minerals and strength   with aging. This can result in serious bone fractures or breaks. The risk of osteoporosis can be identified using a bone density scan. Women ages 65 years and over and women at risk for fractures or osteoporosis should discuss screening with their health care providers. Ask your health care provider whether you should take a calcium supplement or vitamin D to reduce the rate of osteoporosis.  Menopause can be associated with physical symptoms and risks. Hormone replacement therapy is available to decrease symptoms and risks. You should talk to your health care provider about whether hormone replacement therapy is right for you.  Use sunscreen. Apply sunscreen liberally and repeatedly throughout the day. You should seek shade when your shadow is shorter than you. Protect yourself by wearing long sleeves, pants, a wide-brimmed hat, and sunglasses year round, whenever you are outdoors.  Once a month, do a whole body skin exam, using a mirror to look at the skin on your back. Tell your health care provider of new moles, moles that have irregular borders, moles that are larger than a pencil eraser, or moles that have changed in shape or color.  Stay current with required vaccines (immunizations).  Influenza vaccine. All adults should be immunized every year.  Tetanus, diphtheria, and acellular pertussis (Td, Tdap) vaccine. Pregnant women should  receive 1 dose of Tdap vaccine during each pregnancy. The dose should be obtained regardless of the length of time since the last dose. Immunization is preferred during the 27th-36th week of gestation. An adult who has not previously received Tdap or who does not know her vaccine status should receive 1 dose of Tdap. This initial dose should be followed by tetanus and diphtheria toxoids (Td) booster doses every 10 years. Adults with an unknown or incomplete history of completing a 3-dose immunization series with Td-containing vaccines should begin or complete a primary immunization series including a Tdap dose. Adults should receive a Td booster every 10 years.  Varicella vaccine. An adult without evidence of immunity to varicella should receive 2 doses or a second dose if she has previously received 1 dose. Pregnant females who do not have evidence of immunity should receive the first dose after pregnancy. This first dose should be obtained before leaving the health care facility. The second dose should be obtained 4-8 weeks after the first dose.  Human papillomavirus (HPV) vaccine. Females aged 13-26 years who have not received the vaccine previously should obtain the 3-dose series. The vaccine is not recommended for use in pregnant females. However, pregnancy testing is not needed before receiving a dose. If a female is found to be pregnant after receiving a dose, no treatment is needed. In that case, the remaining doses should be delayed until after the pregnancy. Immunization is recommended for any person with an immunocompromised condition through the age of 26 years if she did not get any or all doses earlier. During the 3-dose series, the second dose should be obtained 4-8 weeks after the first dose. The third dose should be obtained 24 weeks after the first dose and 16 weeks after the second dose.  Zoster vaccine. One dose is recommended for adults aged 60 years or older unless certain conditions are  present.  Measles, mumps, and rubella (MMR) vaccine. Adults born before 1957 generally are considered immune to measles and mumps. Adults born in 1957 or later should have 1 or more doses of MMR vaccine unless there is a contraindication to the vaccine or there is laboratory evidence of immunity to   each of the three diseases. A routine second dose of MMR vaccine should be obtained at least 28 days after the first dose for students attending postsecondary schools, health care workers, or international travelers. People who received inactivated measles vaccine or an unknown type of measles vaccine during 1963-1967 should receive 2 doses of MMR vaccine. People who received inactivated mumps vaccine or an unknown type of mumps vaccine before 1979 and are at high risk for mumps infection should consider immunization with 2 doses of MMR vaccine. For females of childbearing age, rubella immunity should be determined. If there is no evidence of immunity, females who are not pregnant should be vaccinated. If there is no evidence of immunity, females who are pregnant should delay immunization until after pregnancy. Unvaccinated health care workers born before 1957 who lack laboratory evidence of measles, mumps, or rubella immunity or laboratory confirmation of disease should consider measles and mumps immunization with 2 doses of MMR vaccine or rubella immunization with 1 dose of MMR vaccine.  Pneumococcal 13-valent conjugate (PCV13) vaccine. When indicated, a person who is uncertain of her immunization history and has no record of immunization should receive the PCV13 vaccine. An adult aged 19 years or older who has certain medical conditions and has not been previously immunized should receive 1 dose of PCV13 vaccine. This PCV13 should be followed with a dose of pneumococcal polysaccharide (PPSV23) vaccine. The PPSV23 vaccine dose should be obtained at least 8 weeks after the dose of PCV13 vaccine. An adult aged 19  years or older who has certain medical conditions and previously received 1 or more doses of PPSV23 vaccine should receive 1 dose of PCV13. The PCV13 vaccine dose should be obtained 1 or more years after the last PPSV23 vaccine dose.  Pneumococcal polysaccharide (PPSV23) vaccine. When PCV13 is also indicated, PCV13 should be obtained first. All adults aged 65 years and older should be immunized. An adult younger than age 65 years who has certain medical conditions should be immunized. Any person who resides in a nursing home or long-term care facility should be immunized. An adult smoker should be immunized. People with an immunocompromised condition and certain other conditions should receive both PCV13 and PPSV23 vaccines. People with human immunodeficiency virus (HIV) infection should be immunized as soon as possible after diagnosis. Immunization during chemotherapy or radiation therapy should be avoided. Routine use of PPSV23 vaccine is not recommended for American Indians, [place] Natives, or people younger than 65 years unless there are medical conditions that require PPSV23 vaccine. When indicated, people who have unknown immunization and have no record of immunization should receive PPSV23 vaccine. One-time revaccination 5 years after the first dose of PPSV23 is recommended for people aged 19-64 years who have chronic kidney failure, nephrotic syndrome, asplenia, or immunocompromised conditions. People who received 1-2 doses of PPSV23 before age 65 years should receive another dose of PPSV23 vaccine at age 65 years or later if at least 5 years have passed since the previous dose. Doses of PPSV23 are not needed for people immunized with PPSV23 at or after age 65 years.  Meningococcal vaccine. Adults with asplenia or persistent complement component deficiencies should receive 2 doses of quadrivalent meningococcal conjugate (MenACWY-D) vaccine. The doses should be obtained at least 2 months apart.  Microbiologists working with certain meningococcal bacteria, military recruits, people at risk during an outbreak, and people who travel to or live in countries with a high rate of meningitis should be immunized. A first-year college student up through age   21 years who is living in a residence hall should receive a dose if she did not receive a dose on or after her 16th birthday. Adults who have certain high-risk conditions should receive one or more doses of vaccine.  Hepatitis A vaccine. Adults who wish to be protected from this disease, have certain high-risk conditions, work with hepatitis A-infected animals, work in hepatitis A research labs, or travel to or work in countries with a high rate of hepatitis A should be immunized. Adults who were previously unvaccinated and who anticipate close contact with an international adoptee during the first 60 days after arrival in the [place] from a country with a high rate of hepatitis A should be immunized.  Hepatitis B vaccine. Adults who wish to be protected from this disease, have certain high-risk conditions, may be exposed to blood or other infectious body fluids, are household contacts or sex partners of hepatitis B positive people, are clients or workers in certain care facilities, or travel to or work in countries with a high rate of hepatitis B should be immunized.  Haemophilus influenzae type b (Hib) vaccine. A previously unvaccinated person with asplenia or sickle cell disease or having a scheduled splenectomy should receive 1 dose of Hib vaccine. Regardless of previous immunization, a recipient of a hematopoietic stem cell transplant should receive a 3-dose series 6-12 months after her successful transplant. Hib vaccine is not recommended for adults with HIV infection. Preventive Services / Frequency Ages 64 to 68 years  Blood pressure check.** / Every 1 to 2 years.  Lipid and cholesterol check.** / Every 5 years beginning at age  22.  Clinical breast exam.** / Every 3 years for women in their 88s and 53s.  BRCA-related cancer risk assessment.** / For women who have family members with a BRCA-related cancer (breast, ovarian, tubal, or peritoneal cancers).  Pap test.** / Every 2 years from ages 90 through 51. Every 3 years starting at age 21 through age 56 or 3 with a history of 3 consecutive normal Pap tests.  HPV screening.** / Every 3 years from ages 24 through ages 1 to 46 with a history of 3 consecutive normal Pap tests.  Hepatitis C blood test.** / For any individual with known risks for hepatitis C.  Skin self-exam. / Monthly.  Influenza vaccine. / Every year.  Tetanus, diphtheria, and acellular pertussis (Tdap, Td) vaccine.** / Consult your health care provider. Pregnant women should receive 1 dose of Tdap vaccine during each pregnancy. 1 dose of Td every 10 years.  Varicella vaccine.** / Consult your health care provider. Pregnant females who do not have evidence of immunity should receive the first dose after pregnancy.  HPV vaccine. / 3 doses over 6 months, if 72 and younger. The vaccine is not recommended for use in pregnant females. However, pregnancy testing is not needed before receiving a dose.  Measles, mumps, rubella (MMR) vaccine.** / You need at least 1 dose of MMR if you were born in 1957 or later. You may also need a 2nd dose. For females of childbearing age, rubella immunity should be determined. If there is no evidence of immunity, females who are not pregnant should be vaccinated. If there is no evidence of immunity, females who are pregnant should delay immunization until after pregnancy.  Pneumococcal 13-valent conjugate (PCV13) vaccine.** / Consult your health care provider.  Pneumococcal polysaccharide (PPSV23) vaccine.** / 1 to 2 doses if you smoke cigarettes or if you have certain conditions.  Meningococcal vaccine.** /  1 dose if you are age 19 to 21 years and a first-year college  student living in a residence hall, or have one of several medical conditions, you need to get vaccinated against meningococcal disease. You may also need additional booster doses.  Hepatitis A vaccine.** / Consult your health care provider.  Hepatitis B vaccine.** / Consult your health care provider.  Haemophilus influenzae type b (Hib) vaccine.** / Consult your health care provider. Ages 40 to 64 years  Blood pressure check.** / Every 1 to 2 years.  Lipid and cholesterol check.** / Every 5 years beginning at age 20 years.  Lung cancer screening. / Every year if you are aged 55-80 years and have a 30-pack-year history of smoking and currently smoke or have quit within the past 15 years. Yearly screening is stopped once you have quit smoking for at least 15 years or develop a health problem that would prevent you from having lung cancer treatment.  Clinical breast exam.** / Every year after age 40 years.  BRCA-related cancer risk assessment.** / For women who have family members with a BRCA-related cancer (breast, ovarian, tubal, or peritoneal cancers).  Mammogram.** / Every year beginning at age 40 years and continuing for as long as you are in good health. Consult with your health care provider.  Pap test.** / Every 3 years starting at age 30 years through age 65 or 70 years with a history of 3 consecutive normal Pap tests.  HPV screening.** / Every 3 years from ages 30 years through ages 65 to 70 years with a history of 3 consecutive normal Pap tests.  Fecal occult blood test (FOBT) of stool. / Every year beginning at age 50 years and continuing until age 75 years. You may not need to do this test if you get a colonoscopy every 10 years.  Flexible sigmoidoscopy or colonoscopy.** / Every 5 years for a flexible sigmoidoscopy or every 10 years for a colonoscopy beginning at age 50 years and continuing until age 75 years.  Hepatitis C blood test.** / For all people born from 1945 through  1965 and any individual with known risks for hepatitis C.  Skin self-exam. / Monthly.  Influenza vaccine. / Every year.  Tetanus, diphtheria, and acellular pertussis (Tdap/Td) vaccine.** / Consult your health care provider. Pregnant women should receive 1 dose of Tdap vaccine during each pregnancy. 1 dose of Td every 10 years.  Varicella vaccine.** / Consult your health care provider. Pregnant females who do not have evidence of immunity should receive the first dose after pregnancy.  Zoster vaccine.** / 1 dose for adults aged 60 years or older.  Measles, mumps, rubella (MMR) vaccine.** / You need at least 1 dose of MMR if you were born in 1957 or later. You may also need a 2nd dose. For females of childbearing age, rubella immunity should be determined. If there is no evidence of immunity, females who are not pregnant should be vaccinated. If there is no evidence of immunity, females who are pregnant should delay immunization until after pregnancy.  Pneumococcal 13-valent conjugate (PCV13) vaccine.** / Consult your health care provider.  Pneumococcal polysaccharide (PPSV23) vaccine.** / 1 to 2 doses if you smoke cigarettes or if you have certain conditions.  Meningococcal vaccine.** / Consult your health care provider.  Hepatitis A vaccine.** / Consult your health care provider.  Hepatitis B vaccine.** / Consult your health care provider.  Haemophilus influenzae type b (Hib) vaccine.** / Consult your health care provider. Ages 65   years and over  Blood pressure check.** / Every 1 to 2 years.  Lipid and cholesterol check.** / Every 5 years beginning at age 22 years.  Lung cancer screening. / Every year if you are aged 73-80 years and have a 30-pack-year history of smoking and currently smoke or have quit within the past 15 years. Yearly screening is stopped once you have quit smoking for at least 15 years or develop a health problem that would prevent you from having lung cancer  treatment.  Clinical breast exam.** / Every year after age 4 years.  BRCA-related cancer risk assessment.** / For women who have family members with a BRCA-related cancer (breast, ovarian, tubal, or peritoneal cancers).  Mammogram.** / Every year beginning at age 40 years and continuing for as long as you are in good health. Consult with your health care provider.  Pap test.** / Every 3 years starting at age 9 years through age 34 or 91 years with 3 consecutive normal Pap tests. Testing can be stopped between 65 and 70 years with 3 consecutive normal Pap tests and no abnormal Pap or HPV tests in the past 10 years.  HPV screening.** / Every 3 years from ages 57 years through ages 64 or 45 years with a history of 3 consecutive normal Pap tests. Testing can be stopped between 65 and 70 years with 3 consecutive normal Pap tests and no abnormal Pap or HPV tests in the past 10 years.  Fecal occult blood test (FOBT) of stool. / Every year beginning at age 15 years and continuing until age 17 years. You may not need to do this test if you get a colonoscopy every 10 years.  Flexible sigmoidoscopy or colonoscopy.** / Every 5 years for a flexible sigmoidoscopy or every 10 years for a colonoscopy beginning at age 86 years and continuing until age 71 years.  Hepatitis C blood test.** / For all people born from [birth year] through 1967 and any individual with known risks for hepatitis C.  Osteoporosis screening.** / A one-time screening for women ages 83 years and over and women at risk for fractures or osteoporosis.  Skin self-exam. / Monthly.  Influenza vaccine. / Every year.  Tetanus, diphtheria, and acellular pertussis (Tdap/Td) vaccine.** / 1 dose of Td every 10 years.  Varicella vaccine.** / Consult your health care provider.  Zoster vaccine.** / 1 dose for adults aged 61 years or older.  Pneumococcal 13-valent conjugate (PCV13) vaccine.** / Consult your health care provider.  Pneumococcal  polysaccharide (PPSV23) vaccine.** / 1 dose for all adults aged 28 years and older.  Meningococcal vaccine.** / Consult your health care provider.  Hepatitis A vaccine.** / Consult your health care provider.  Hepatitis B vaccine.** / Consult your health care provider.  Haemophilus influenzae type b (Hib) vaccine.** / Consult your health care provider. ** Family history and personal history of risk and conditions may change your health care provider's recommendations. Document Released: 02/26/2001 Document Revised: 05/17/2013 Document Reviewed: 05/28/2010 [facility] Patient Information 2015 [place], [place]. This information is not intended to replace advice given to you by your health care provider. Make sure you discuss any questions you have with your health care provider.

## 2014-10-16 IMAGING — CT CT ABD-PELV W/ CM
2 of 5 series · 17 of 46 positions shown, 19 images · IV contrast (APPLIED)
Comparison: Chest radiographs same date.

CLINICAL DATA: Lower to mid back pain with nausea and vomiting.

CT ABDOMEN AND PELVIS WITH CONTRAST
TECHNIQUE: Multidetector CT imaging of the abdomen and pelvis was
performed following the standard protocol during bolus
administration of intravenous contrast.
Contrast: 80mL OMNIPAQUE IOHEXOL 300 MG/ML  SOLN

[Series 2: abd/pelv with 5.0 b31f st · axial · 0.60mm/px · z∈[-443,-58]mm · 14 of 87 slices shown, 16 images]
[im 5/87  soft-tissue]
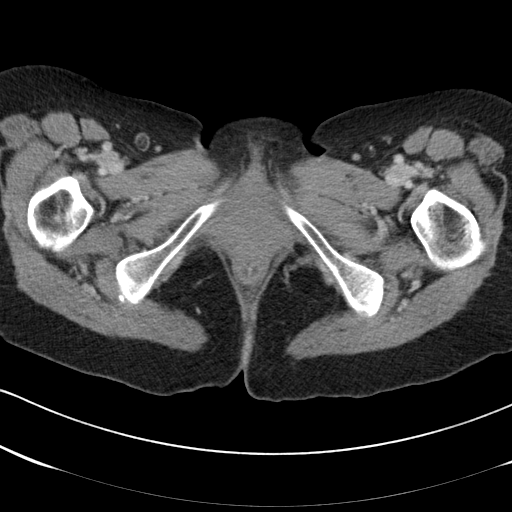
[im 5/87  bone]
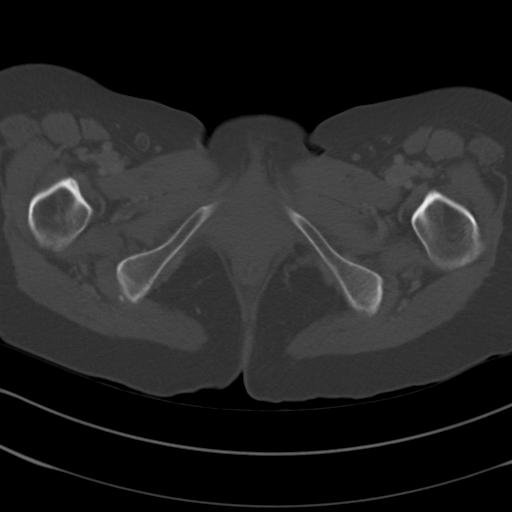
[im 13/87  soft-tissue]
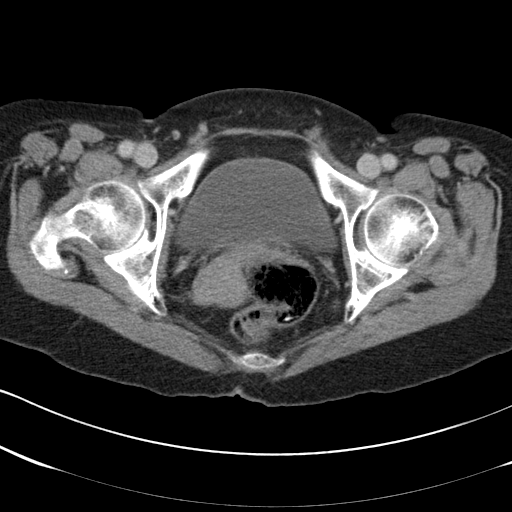
[im 18/87  soft-tissue]
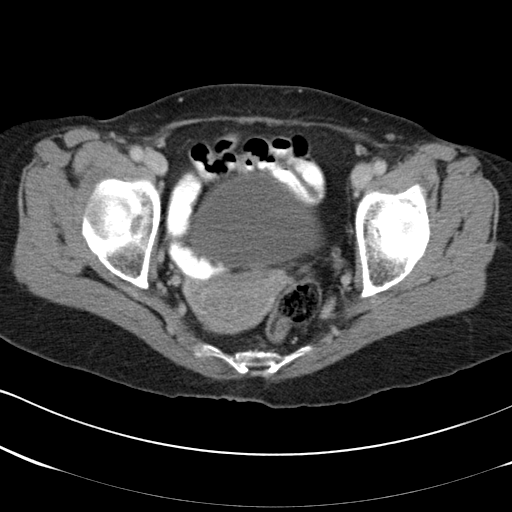
[im 22/87  soft-tissue]
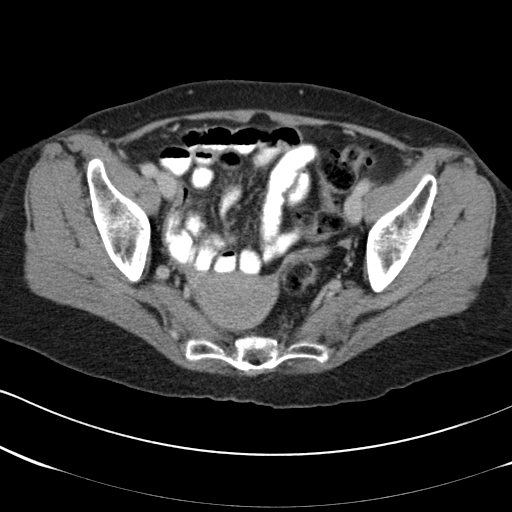
[im 31/87  soft-tissue]
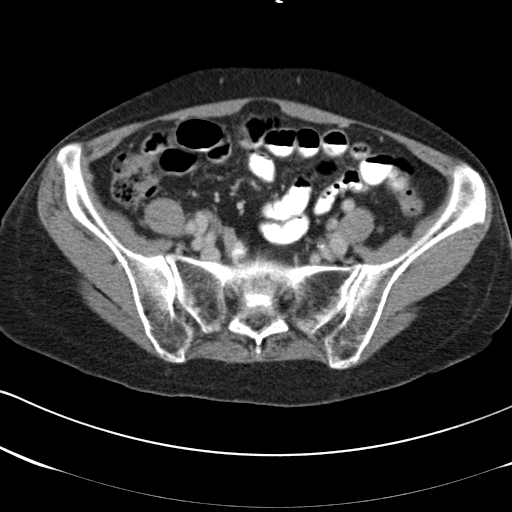
[im 35/87  soft-tissue]
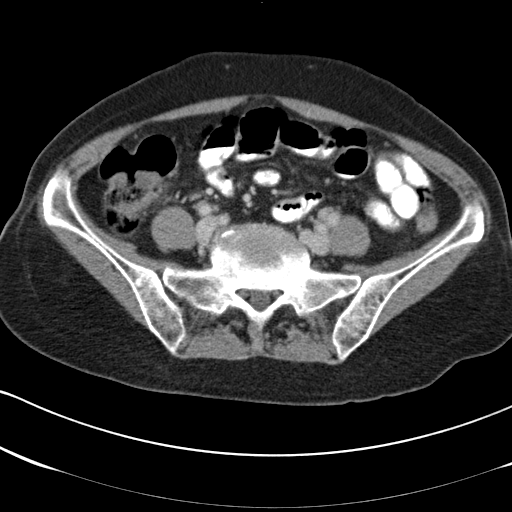
[im 39/87  soft-tissue]
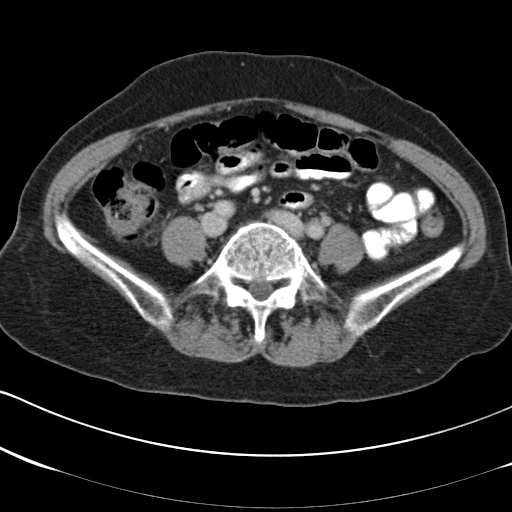
[im 48/87  soft-tissue]
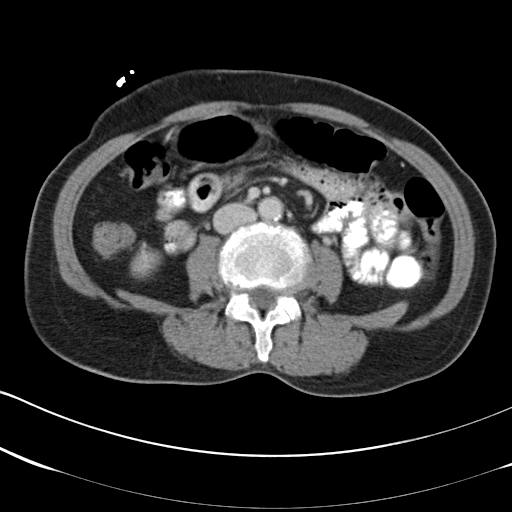
[im 52/87  soft-tissue]
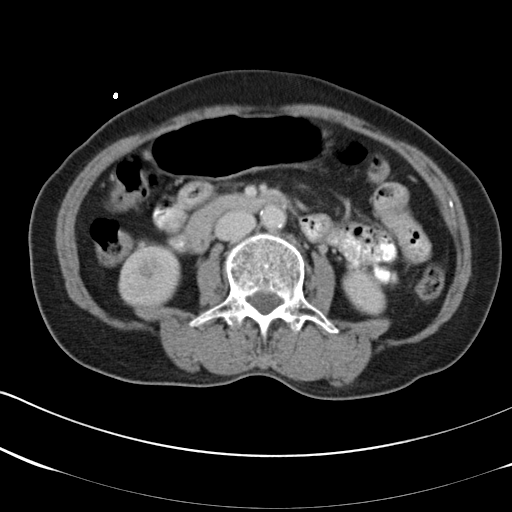
[im 52/87  bone]
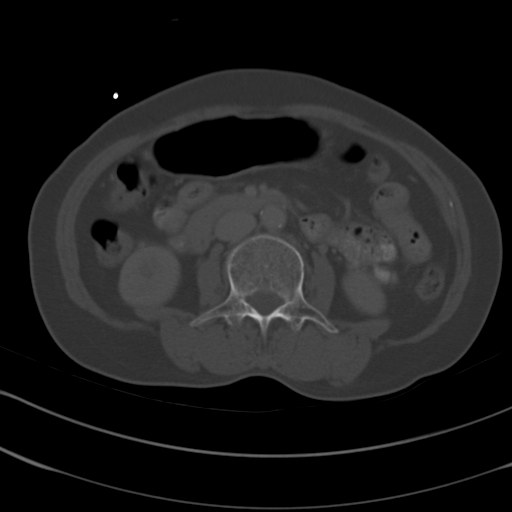
[im 56/87  soft-tissue]
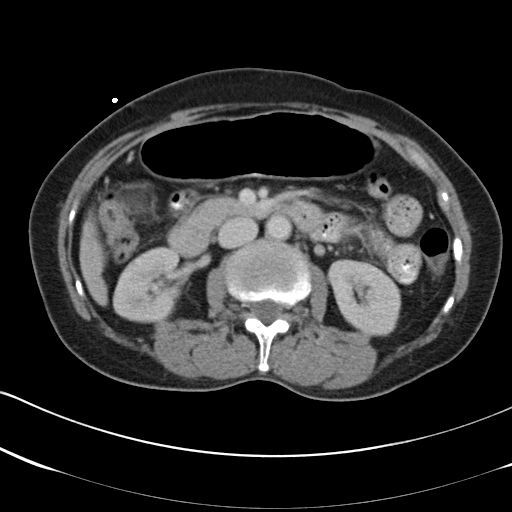
[im 65/87  soft-tissue]
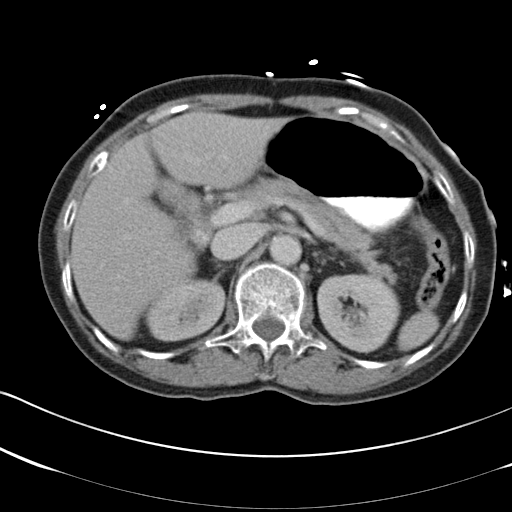
[im 69/87  soft-tissue]
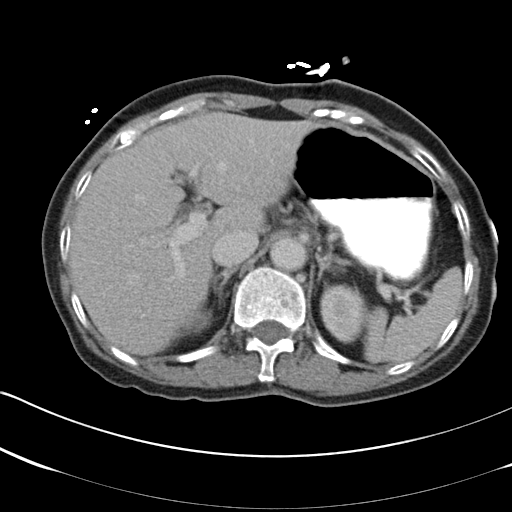
[im 74/87  soft-tissue]
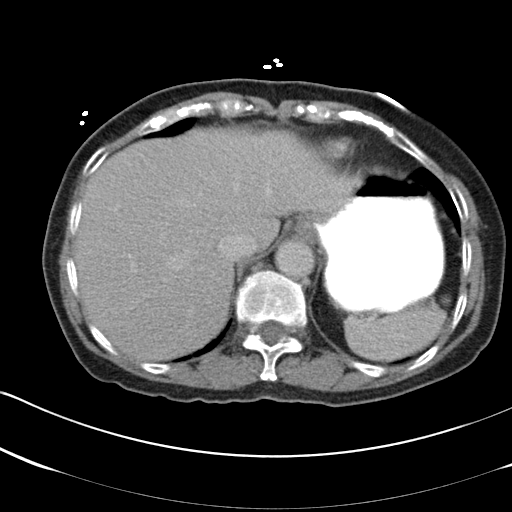
[im 82/87  soft-tissue]
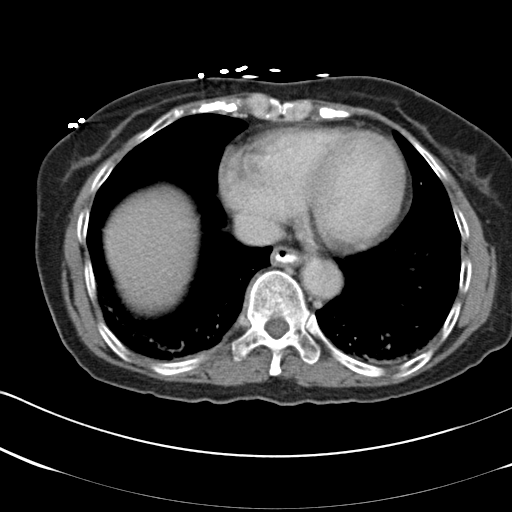

[Series 5: coronals · coronal · 0.83mm/px · 3 of 91 slices shown]
[im 31/91  soft-tissue]
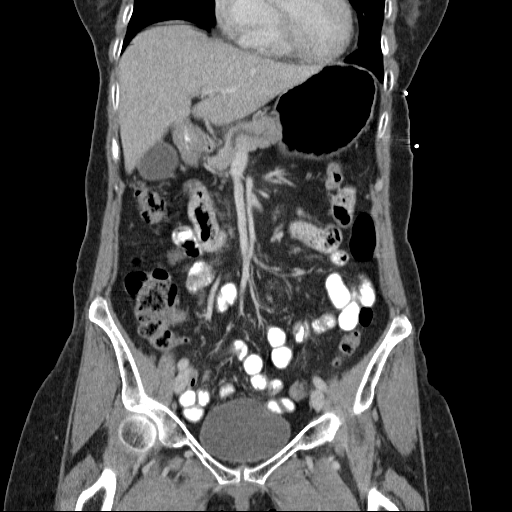
[im 41/91  soft-tissue]
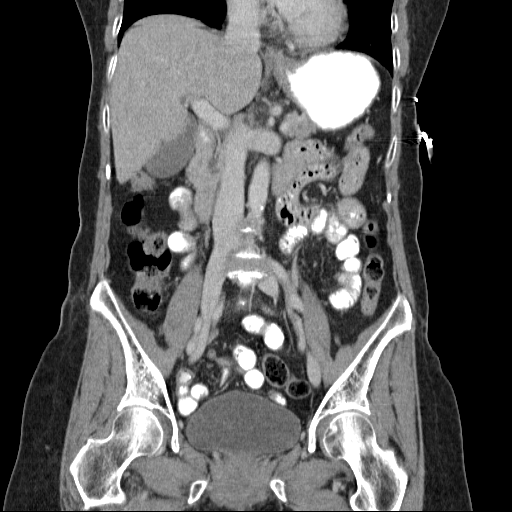
[im 51/91  soft-tissue]
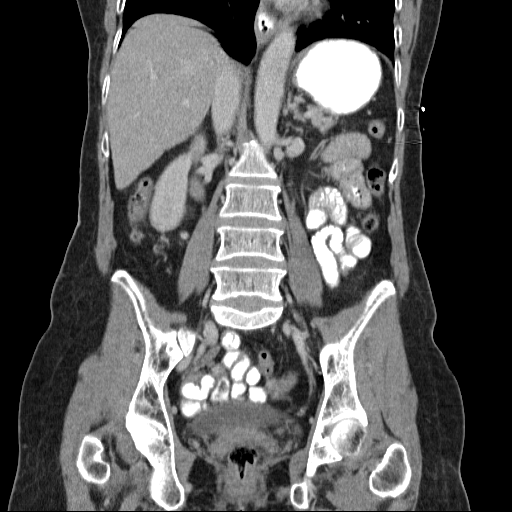

[17 of 46 positions shown; findings below may reference images not displayed]

FINDINGS: Images through the lung bases demonstrate mild basilar
atelectasis and a small hiatal hernia.  There is no pleural
effusion.

The liver, gallbladder, biliary system and pancreas appear
unremarkable.  The spleen, adrenal glands and kidneys appear
unremarkable.

The stomach, small bowel, appendix and colon appear normal.  There
is minimal aorto iliac atherosclerosis.  No enlarged abdominal
pelvic lymph nodes are seen.  Uterus, ovaries and bladder appear
normal.

There is a mild acute appearing superior endplate compression
deformity at L1.  There is no associated osseous retropulsion or
significant paraspinal hematoma.  No other fractures are
identified.
IMPRESSION: 1.  Acute appearing mild superior endplate compression deformity at
L1.
2.  No acute abdominal pelvic findings.

## 2015-01-31 ENCOUNTER — Ambulatory Visit (INDEPENDENT_AMBULATORY_CARE_PROVIDER_SITE_OTHER): Payer: BLUE CROSS/BLUE SHIELD | Admitting: Osteopathic Medicine

## 2015-01-31 ENCOUNTER — Encounter: Payer: Self-pay | Admitting: Osteopathic Medicine

## 2015-01-31 VITALS — BP 122/83 | HR 79 | Ht 66.5 in | Wt 135.0 lb

## 2015-01-31 DIAGNOSIS — Z780 Asymptomatic menopausal state: Secondary | ICD-10-CM

## 2015-01-31 DIAGNOSIS — E785 Hyperlipidemia, unspecified: Secondary | ICD-10-CM | POA: Diagnosis not present

## 2015-01-31 DIAGNOSIS — Z23 Encounter for immunization: Secondary | ICD-10-CM

## 2015-01-31 DIAGNOSIS — Z79899 Other long term (current) drug therapy: Secondary | ICD-10-CM

## 2015-01-31 DIAGNOSIS — R2230 Localized swelling, mass and lump, unspecified upper limb: Secondary | ICD-10-CM | POA: Insufficient documentation

## 2015-01-31 DIAGNOSIS — Z1239 Encounter for other screening for malignant neoplasm of breast: Secondary | ICD-10-CM

## 2015-01-31 DIAGNOSIS — I10 Essential (primary) hypertension: Secondary | ICD-10-CM

## 2015-01-31 DIAGNOSIS — R2233 Localized swelling, mass and lump, upper limb, bilateral: Secondary | ICD-10-CM

## 2015-01-31 NOTE — Progress Notes (Signed)
HPI: Katrina Baird is a 65 y.o. female who presents to [facility] today for chief complaint of:  Chief Complaint  Patient presents with  . Establish Care  . Hypertension  . Hyperlipidemia    Last seen by Dr. 03/2014 Dr. Yetta Barre. Son accompanies to help translate. Pt has no significant complaints other than occasional back pain and at end of visit pt menitoned axillary lump as below. No refills needed.   HTN: controlled, no chest painSOB.  Preventive care: Pt declines female exam and colon cancer screening. Pt is advised that if she has any vaginal bleeding she needs to come see me. Per record review: DEXA was ordered in the past but no results - pt never go this done.   Axillary lump, bilateral: no breast concerns, no painful, has been there "long time" no drainage, no limitation of shoulder movement.    Past medical, social and family history reviewed: Past Medical History  Diagnosis Date  . Hypertension   . Vertigo    No past surgical history on file. Social History  Substance Use Topics  . Smoking status: Never Smoker   . Smokeless tobacco: Never Used  . Alcohol Use: No   Family History  Problem Relation Age of Onset  . Cancer Neg Hx   . Diabetes Neg Hx   . Early death Neg Hx   . Heart disease Neg Hx   . Hyperlipidemia Neg Hx   . Hypertension Neg Hx   . Kidney disease Neg Hx   . Stroke Neg Hx     Current Outpatient Prescriptions  Medication Sig Dispense Refill  . atorvastatin (LIPITOR) 80 MG tablet Take 1 tablet (80 mg total) by mouth daily. 90 tablet 3  . losartan-hydrochlorothiazide (HYZAAR) 100-12.5 MG per tablet Take 1 tablet by mouth daily. 90 tablet 3   No current facility-administered medications for this visit.   No Known Allergies    Review of Systems: CONSTITUTIONAL:  No  fever, no chills, No  unintentional weight changes HEAD/EYES/EARS/NOSE/THROAT: No  headache, no vision change, no hearing change, No  sore throat, No   sinus pressure CARDIAC: No  chest pain, No  pressure, No palpitations, No  orthopnea RESPIRATORY: No  cough, No  shortness of breath/wheeze GASTROINTESTINAL: No  nausea, No  vomiting, No  abdominal pain, No  blood in stool, No  diarrhea, No  constipation  MUSCULOSKELETAL: No  myalgia/arthralgia GENITOURINARY: No  incontinence, No  abnormal genital bleeding/discharge SKIN: No  rash/wounds/concerning lesions HEM/ONC: No  easy bruising/bleeding, No  abnormal lymph node ENDOCRINE: No polyuria/polydipsia/polyphagia, No  heat/cold intolerance  NEUROLOGIC: No  weakness, No  dizziness, No  slurred speech PSYCHIATRIC: No  concerns with depression, No  concerns with anxiety, No sleep problems  Exam:  BP 122/83 mmHg  Pulse 79  Ht 5' 6.5" (1.689 m)  Wt 135 lb (61.236 kg)  BMI 21.47 kg/m2 Constitutional: VS see above. General Appearance: alert, well-developed, well-nourished, NAD Eyes: Normal lids and conjunctive, non-icteric sclera, PERRLA, arcus senilis  Ears, Nose, Mouth, Throat: MMM, Normal external inspection ears/nares/mouth/lips/gums, TM normal bilaterally. Pharynx no erythema, no exudate.  Neck: No masses, trachea midline. No thyroid enlargement/tenderness/mass appreciated. No lymphadenopathy Respiratory: Normal respiratory effort. no wheeze, no rhonchi, no rales Cardiovascular: S1/S2 normal, no murmur, no rub/gallop auscultated. RRR.  Gastrointestinal: Nontender, no masses. No hepatomegaly, no splenomegaly. No hernia appreciated. Bowel sounds normal. Rectal exam deferred.  Musculoskeletal: Gait normal. No clubbing/cyanosis of digits.  Neurological: No cranial nerve deficit on  limited exam. Motor and sensation intact and symmetric Skin: warm, dry, intact. No rash/ulcer. No concerning nevi or subq nodules on limited exam.   Psychiatric: Normal mood and affect.     No results found for this or any previous visit (from the past 72 hour(s)).  Records reviewed: Labs 03/2014 - Lipids ok (LDL  102), CMP WNL, TSH WNL    ASSESSMENT/PLAN: Son to take downstairs for scheduling [place], DEXA, [place]. Labs fasting.   Essential hypertension - Plan: CBC with Differential/Platelet, COMPLETE METABOLIC PANEL WITH GFR  Need for prophylactic vaccination and inoculation against influenza - Plan: Flu Vaccine QUAD 36+ mos IM  Hyperlipidemia - Plan: Lipid panel  Medication management  Postmenopausal - Plan: VITAMIN D 25 Hydroxy (Vit-D Deficiency, Fractures), DG Bone Density  Breast cancer screening - Plan: MM DIGITAL SCREENING BILATERAL  Axillary lump, bilateral - Plan: [place] Misc Soft Tissue    Return in about 6 months (around 07/31/2015) for BLOOD PRESSURE CHECK-UP. COME SOONER IF NEEFED FOR ILLNESS OR OTHER PROBLEMS. Marland Kitchen

## 2015-02-01 ENCOUNTER — Ambulatory Visit (INDEPENDENT_AMBULATORY_CARE_PROVIDER_SITE_OTHER): Payer: BLUE CROSS/BLUE SHIELD

## 2015-02-01 DIAGNOSIS — Z780 Asymptomatic menopausal state: Secondary | ICD-10-CM | POA: Diagnosis not present

## 2015-02-01 DIAGNOSIS — Z1231 Encounter for screening mammogram for malignant neoplasm of breast: Secondary | ICD-10-CM

## 2015-02-01 DIAGNOSIS — R2233 Localized swelling, mass and lump, upper limb, bilateral: Secondary | ICD-10-CM | POA: Diagnosis not present

## 2015-02-01 DIAGNOSIS — M810 Age-related osteoporosis without current pathological fracture: Secondary | ICD-10-CM

## 2015-02-01 DIAGNOSIS — Z1239 Encounter for other screening for malignant neoplasm of breast: Secondary | ICD-10-CM

## 2015-02-01 DIAGNOSIS — Z13820 Encounter for screening for osteoporosis: Secondary | ICD-10-CM | POA: Diagnosis not present

## 2015-02-01 LAB — LIPID PANEL
CHOL/HDL RATIO: 3.2 ratio (ref <=5.0)
CHOLESTEROL: 152 mg/dL (ref 125–200)
HDL: 48 mg/dL (ref >=46)
LDL Cholesterol: 73 mg/dL (ref <130)
Triglycerides: 153 mg/dL — ABNORMAL HIGH (ref <150)
VLDL: 31 mg/dL — ABNORMAL HIGH (ref <30)

## 2015-02-01 LAB — CBC WITH DIFFERENTIAL/PLATELET
BASOS PCT: 0 % (ref 0–1)
Basophils Absolute: 0 10*3/uL (ref 0.0–0.1)
Eosinophils Absolute: 0.1 10*3/uL (ref 0.0–0.7)
Eosinophils Relative: 1 % (ref 0–5)
HCT: 38.3 % (ref 36.0–46.0)
HEMOGLOBIN: 12.8 g/dL (ref 12.0–15.0)
LYMPHS ABS: 2.1 10*3/uL (ref 0.7–4.0)
Lymphocytes Relative: 28 % (ref 12–46)
MCH: 25.9 pg — AB (ref 26.0–34.0)
MCHC: 33.4 g/dL (ref 30.0–36.0)
MCV: 77.5 fL — ABNORMAL LOW (ref 78.0–100.0)
MONO ABS: 0.5 10*3/uL (ref 0.1–1.0)
MONOS PCT: 6 % (ref 3–12)
MPV: 12 fL (ref 8.6–12.4)
NEUTROS ABS: 4.9 10*3/uL (ref 1.7–7.7)
Neutrophils Relative %: 65 % (ref 43–77)
Platelets: 242 10*3/uL (ref 150–400)
RBC: 4.94 MIL/uL (ref 3.87–5.11)
RDW: 15.1 % (ref 11.5–15.5)
WBC: 7.5 10*3/uL (ref 4.0–10.5)

## 2015-02-01 LAB — COMPLETE METABOLIC PANEL WITH GFR
ALBUMIN: 4.1 g/dL (ref 3.6–5.1)
ALK PHOS: 39 U/L (ref 33–130)
ALT: 25 U/L (ref 6–29)
AST: 21 U/L (ref 10–35)
BILIRUBIN TOTAL: 0.8 mg/dL (ref 0.2–1.2)
BUN: 16 mg/dL (ref 7–25)
CALCIUM: 9.6 mg/dL (ref 8.6–10.4)
CO2: 32 mmol/L — ABNORMAL HIGH (ref 20–31)
Chloride: 100 mmol/L (ref 98–110)
Creat: 0.83 mg/dL (ref 0.50–0.99)
GFR, EST NON AFRICAN AMERICAN: 74 mL/min (ref >=60)
GFR, Est African American: 85 mL/min (ref >=60)
GLUCOSE: 96 mg/dL (ref 65–99)
POTASSIUM: 3.3 mmol/L — AB (ref 3.5–5.3)
SODIUM: 142 mmol/L (ref 135–146)
TOTAL PROTEIN: 7.2 g/dL (ref 6.1–8.1)

## 2015-02-02 LAB — VITAMIN D 25 HYDROXY (VIT D DEFICIENCY, FRACTURES): VIT D 25 HYDROXY: 21 ng/mL — AB (ref 30–100)

## 2015-02-03 DIAGNOSIS — M810 Age-related osteoporosis without current pathological fracture: Secondary | ICD-10-CM

## 2015-02-03 HISTORY — DX: Age-related osteoporosis without current pathological fracture: M81.0

## 2015-02-03 MED ORDER — CALCIUM CARBONATE-VITAMIN D 600-400 MG-UNIT PO CHEW: 2.0000 | CHEWABLE_TABLET | Freq: Every day | ORAL | Status: DC

## 2015-02-03 MED ORDER — ALENDRONATE SODIUM 10 MG PO TABS: 10.0000 mg | ORAL_TABLET | Freq: Every day | ORAL | Status: DC

## 2015-02-23 ENCOUNTER — Other Ambulatory Visit: Payer: Self-pay | Admitting: Internal Medicine

## 2015-04-20 ENCOUNTER — Other Ambulatory Visit: Payer: Self-pay | Admitting: Internal Medicine

## 2015-04-25 ENCOUNTER — Telehealth: Payer: Self-pay | Admitting: *Deleted

## 2015-04-25 MED ORDER — LOSARTAN POTASSIUM-HCTZ 100-12.5 MG PO TABS: 1.0000 | ORAL_TABLET | Freq: Every day | ORAL | Status: DC

## 2015-04-25 NOTE — Telephone Encounter (Signed)
Pt.notified

## 2015-04-25 NOTE — Telephone Encounter (Signed)
Sent refill for 6 mos, followup in clinic for BP recheck I think is due in July.

## 2015-04-25 NOTE — Telephone Encounter (Signed)
Pt requests refills of hyzaar. Have to rout this to provider since we have not filled this medication

## 2015-05-09 ENCOUNTER — Other Ambulatory Visit: Payer: Self-pay | Admitting: Internal Medicine

## 2015-05-10 ENCOUNTER — Telehealth: Payer: Self-pay | Admitting: Osteopathic Medicine

## 2015-05-10 MED ORDER — ATORVASTATIN CALCIUM 80 MG PO TABS: 80.0000 mg | ORAL_TABLET | Freq: Every day | ORAL | Status: DC

## 2015-05-10 NOTE — Telephone Encounter (Signed)
Sent, can call patient and let her know

## 2015-05-10 NOTE — Telephone Encounter (Signed)
Patient needs refill on lipitor 80 mg sent to [facility].  thanks

## 2015-05-10 NOTE — Telephone Encounter (Signed)
Patient has been informed. Rhonda Cunningham,CMA  

## 2015-07-20 ENCOUNTER — Other Ambulatory Visit: Payer: Self-pay | Admitting: Osteopathic Medicine

## 2015-08-08 ENCOUNTER — Ambulatory Visit (INDEPENDENT_AMBULATORY_CARE_PROVIDER_SITE_OTHER): Payer: BLUE CROSS/BLUE SHIELD | Admitting: Osteopathic Medicine

## 2015-08-08 ENCOUNTER — Encounter: Payer: Self-pay | Admitting: Osteopathic Medicine

## 2015-08-08 VITALS — BP 147/94 | HR 73 | Ht 65.5 in | Wt 140.0 lb

## 2015-08-08 DIAGNOSIS — M542 Cervicalgia: Secondary | ICD-10-CM | POA: Diagnosis not present

## 2015-08-08 DIAGNOSIS — I10 Essential (primary) hypertension: Secondary | ICD-10-CM

## 2015-08-08 DIAGNOSIS — M810 Age-related osteoporosis without current pathological fracture: Secondary | ICD-10-CM

## 2015-08-08 DIAGNOSIS — T148 Other injury of unspecified body region: Secondary | ICD-10-CM

## 2015-08-08 DIAGNOSIS — W57XXXA Bitten or stung by nonvenomous insect and other nonvenomous arthropods, initial encounter: Secondary | ICD-10-CM

## 2015-08-08 MED ORDER — LOSARTAN POTASSIUM-HCTZ 100-12.5 MG PO TABS: 1.0000 | ORAL_TABLET | Freq: Every day | ORAL | 1 refills | Status: DC

## 2015-08-08 MED ORDER — ALENDRONATE SODIUM 10 MG PO TABS: 10.0000 mg | ORAL_TABLET | Freq: Every day | ORAL | 3 refills | Status: DC

## 2015-08-08 MED ORDER — ATORVASTATIN CALCIUM 80 MG PO TABS: 80.0000 mg | ORAL_TABLET | Freq: Every day | ORAL | 1 refills | Status: DC

## 2015-08-08 MED ORDER — NAPROXEN 375 MG PO TABS: 375.0000 mg | ORAL_TABLET | Freq: Two times a day (BID) | ORAL | 2 refills | Status: DC

## 2015-08-08 MED ORDER — HYDROCORTISONE 2.5 % EX OINT: TOPICAL_OINTMENT | Freq: Two times a day (BID) | CUTANEOUS | 1 refills | Status: DC | PRN

## 2015-08-08 MED ORDER — CALCIUM CARBONATE-VITAMIN D 600-400 MG-UNIT PO CHEW: 2.0000 | CHEWABLE_TABLET | Freq: Every day | ORAL | 1 refills | Status: DC

## 2015-08-08 NOTE — Progress Notes (Signed)
HPI: Katrina Baird is a 65 y.o. Not Hispanic or Latino female  who presents to [facility] today, 08/08/15,  for chief complaint of:  Chief Complaint  Patient presents with  . Follow-up    BLOOD PRESSURE     BLOOD PRESSURE - hasn't taken medications today, no CP/SOB  BUG BITES - itching on skin, son has been giving her skin cream, requests prescription   NECK TIGHTNESS - ongoing many years, Advil helps, no history injury   Patient is accompanied by son who assists with history-taking.   Past medical, surgical, social and family history reviewed: Past Medical History:  Diagnosis Date  . Hypertension   . Vertigo    No past surgical history on file. Social History  Substance Use Topics  . Smoking status: Never Smoker  . Smokeless tobacco: Never Used  . Alcohol use No   Family History  Problem Relation Age of Onset  . Cancer Neg Hx   . Diabetes Neg Hx   . Early death Neg Hx   . Heart disease Neg Hx   . Hyperlipidemia Neg Hx   . Hypertension Neg Hx   . Kidney disease Neg Hx   . Stroke Neg Hx      Current medication list and allergy/intolerance information reviewed:   Current Outpatient Prescriptions  Medication Sig Dispense Refill  . alendronate (FOSAMAX) 10 MG tablet Take 1 tablet (10 mg total) by mouth daily before breakfast. 90 tablet 3  . atorvastatin (LIPITOR) 80 MG tablet TAKE 1 TABLET (80 MG TOTAL) BY MOUTH DAILY. 90 tablet 0  . Calcium Carbonate-Vitamin D 600-400 MG-UNIT chew tablet Chew 2 tablets by mouth daily. 60 tablet 12  . losartan-hydrochlorothiazide (HYZAAR) 100-12.5 MG tablet Take 1 tablet by mouth daily. 90 tablet 1   No current facility-administered medications for this visit.    No Known Allergies    Review of Systems:  Constitutional:  No illness  HEENT: No  headache, no vision change  Cardiac: No  chest pain  Respiratory:  No  shortness of breath.  Gastrointestinal: No  abdominal pain  Musculoskeletal:  No new myalgia/arthralgia - neck pain as per HPI  Skin: (+) itching Rash, No other wounds/concerning lesions  Exam:  BP (!) 147/94   Pulse 73   Ht 5' 5.5" (1.664 m)   Wt 140 lb (63.5 kg)   BMI 22.94 kg/m   Constitutional: VS see above. General Appearance: alert, well-developed, well-nourished, NAD  Ears, Nose, Mouth, Throat: MMM, Normal external inspection ears/nares/mouth/lips/gums.   Neck: No masses, trachea midline. No thyroid enlargement. No tenderness/mass appreciated. No lymphadenopathy. Normal ROM but reports tightness with forward flexion. Mild paraspinal tenderness a bit worse on R  Respiratory: Normal respiratory effort. no wheeze, no rhonchi, no rales  Cardiovascular: S1/S2 normal, no murmur, no rub/gallop auscultated. RRR.   Skin: (+) excoriations lower extremities and forearms c/w insect bite urticariat. No other rash/ulcer. No concerning nevi or subq nodules on limited exam.       ASSESSMENT/PLAN:   Essential hypertension - Need to be taking medications prior to visit so we know whether they are working, advised patient of this, RTC for nurse visit recheck BP - Plan: atorvastatin (LIPITOR) 80 MG tablet, losartan-hydrochlorothiazide (HYZAAR) 100-12.5 MG tablet  Neck pain, musculoskeletal - Likely arthritis, naproxen when necessary, home stretching exercises advised, further workup depending on symptoms - Plan: naproxen (NAPROSYN) 375 MG tablet  Insect bite - Insect bite urticaria, sparing use of topical steroids for itching,  advised insect repellent when outside, less likely scabies, monitor close - Plan: hydrocortisone 2.5 % ointment  Osteoporosis - Plan: alendronate (FOSAMAX) 10 MG tablet, Calcium Carbonate-Vitamin D 600-400 MG-UNIT chew tablet     Visit summary with medication list and pertinent instructions was printed for patient to review. All questions at time of visit were answered - patient instructed to contact office with any additional concerns. ER/RTC  precautions were reviewed with the patient. Follow-up plan: No Follow-up on file.  Note: Total time spent 25 minutes, greater than 50% of the visit was spent face-to-face counseling and coordinating care for the following: The primary encounter diagnosis was Essential hypertension. Diagnoses of Neck pain, musculoskeletal, Insect bite, and Osteoporosis were also pertinent to this visit.Marland Kitchen

## 2015-08-08 NOTE — Patient Instructions (Addendum)
All patients need to take medications as normal, do not skip medicines before coming to the doctor's office. You can take medicines with water. We need to know that the blood pressure medication is working, so please come back to the office for a nurse visit in 2 weeks to check the blood pressure when you are taking the medicines as usual.  Please be aware that you are due for colon cancer screening, this can be done with colonoscopy or with home stool tests. Let Katrina Baird know if you would like to talk more about her options for this.  I have also sent a medicine to take as needed for neck pain, see instructions below for stretching exercises for the neck. If your pain continues or gets worse, let Katrina Baird know  I sent medicine to use as needed for itching, please note that frequent use of this medicine can cause thinning and whitening of the skin, please only use as needed. You can also use oral antihistamine such as Benadryl or Claritin for significant itching.    EXERCISES  RANGE OF MOTION (ROM) AND STRETCHING EXERCISES - Cervical Strain and Sprain These exercises may help you when beginning to rehabilitate your injury. In order to successfully resolve your symptoms, you must improve your posture. These exercises are designed to help reduce the forward-head and rounded-shoulder posture which contributes to this condition. Your symptoms may resolve with or without further involvement from your physician, physical therapist or athletic trainer. While completing these exercises, remember:   Restoring tissue flexibility helps normal motion to return to the joints. This allows healthier, less painful movement and activity.  An effective stretch should be held for at least 20 seconds, although you may need to begin with shorter hold times for comfort.  A stretch should never be painful. You should only feel a gentle lengthening or release in the stretched tissue. STRETCH- Axial Extensors  Lie on your back on  the floor. You may bend your knees for comfort. Place a rolled-up hand towel or dish towel, about 2 inches in diameter, under the part of your head that makes contact with the floor.  Gently tuck your chin, as if trying to make a "double chin," until you feel a gentle stretch at the base of your head.  Hold __________ seconds. Repeat __________ times. Complete this exercise __________ times per day.  STRETCH - Axial Extension   Stand or sit on a firm surface. Assume a good posture: chest up, shoulders drawn back, abdominal muscles slightly tense, knees unlocked (if standing) and feet hip width apart.  Slowly retract your chin so your head slides back and your chin slightly lowers. Continue to look straight ahead.  You should feel a gentle stretch in the back of your head. Be certain not to feel an aggressive stretch since this can cause headaches later.  Hold for __________ seconds. Repeat __________ times. Complete this exercise __________ times per day. STRETCH - Cervical Side Bend   Stand or sit on a firm surface. Assume a good posture: chest up, shoulders drawn back, abdominal muscles slightly tense, knees unlocked (if standing) and feet hip width apart.  Without letting your nose or shoulders move, slowly tip your right / left ear to your shoulder until your feel a gentle stretch in the muscles on the opposite side of your neck.  Hold __________ seconds. Repeat __________ times. Complete this exercise __________ times per day. STRETCH - Cervical Rotators   Stand or sit on a firm surface.  Assume a good posture: chest up, shoulders drawn back, abdominal muscles slightly tense, knees unlocked (if standing) and feet hip width apart.  Keeping your eyes level with the ground, slowly turn your head until you feel a gentle stretch along the back and opposite side of your neck.  Hold __________ seconds. Repeat __________ times. Complete this exercise __________ times per day. RANGE OF  MOTION - Neck Circles   Stand or sit on a firm surface. Assume a good posture: chest up, shoulders drawn back, abdominal muscles slightly tense, knees unlocked (if standing) and feet hip width apart.  Gently roll your head down and around from the back of one shoulder to the back of the other. The motion should never be forced or painful.  Repeat the motion 10-20 times, or until you feel the neck muscles relax and loosen. Repeat __________ times. Complete the exercise __________ times per day. STRENGTHENING EXERCISES - Cervical Strain and Sprain These exercises may help you when beginning to rehabilitate your injury. They may resolve your symptoms with or without further involvement from your physician, physical therapist, or athletic trainer. While completing these exercises, remember:   Muscles can gain both the endurance and the strength needed for everyday activities through controlled exercises.  Complete these exercises as instructed by your physician, physical therapist, or athletic trainer. Progress the resistance and repetitions only as guided.  You may experience muscle soreness or fatigue, but the pain or discomfort you are trying to eliminate should never worsen during these exercises. If this pain does worsen, stop and make certain you are following the directions exactly. If the pain is still present after adjustments, discontinue the exercise until you can discuss the trouble with your clinician. STRENGTH - Cervical Flexors, Isometric  Face a wall, standing about 6 inches away. Place a small pillow, a ball about 6-8 inches in diameter, or a folded towel between your forehead and the wall.  Slightly tuck your chin and gently push your forehead into the soft object. Push only with mild to moderate intensity, building up tension gradually. Keep your jaw and forehead relaxed.  Hold 10 to 20 seconds. Keep your breathing relaxed.  Release the tension slowly. Relax your neck muscles  completely before you start the next repetition. Repeat __________ times. Complete this exercise __________ times per day. STRENGTH- Cervical Lateral Flexors, Isometric   Stand about 6 inches away from a wall. Place a small pillow, a ball about 6-8 inches in diameter, or a folded towel between the side of your head and the wall.  Slightly tuck your chin and gently tilt your head into the soft object. Push only with mild to moderate intensity, building up tension gradually. Keep your jaw and forehead relaxed.  Hold 10 to 20 seconds. Keep your breathing relaxed.  Release the tension slowly. Relax your neck muscles completely before you start the next repetition. Repeat __________ times. Complete this exercise __________ times per day. STRENGTH - Cervical Extensors, Isometric   Stand about 6 inches away from a wall. Place a small pillow, a ball about 6-8 inches in diameter, or a folded towel between the back of your head and the wall.  Slightly tuck your chin and gently tilt your head back into the soft object. Push only with mild to moderate intensity, building up tension gradually. Keep your jaw and forehead relaxed.  Hold 10 to 20 seconds. Keep your breathing relaxed.  Release the tension slowly. Relax your neck muscles completely before you start the next  repetition. Repeat __________ times. Complete this exercise __________ times per day. POSTURE AND BODY MECHANICS CONSIDERATIONS - Cervical Strain and Sprain Keeping correct posture when sitting, standing or completing your activities will reduce the stress put on different body tissues, allowing injured tissues a chance to heal and limiting painful experiences. The following are general guidelines for improved posture. Your physician or physical therapist will provide you with any instructions specific to your needs. While reading these guidelines, remember:  The exercises prescribed by your provider will help you have the flexibility and  strength to maintain correct postures.  The correct posture provides the optimal environment for your joints to work. All of your joints have less wear and tear when properly supported by a spine with good posture. This means you will experience a healthier, less painful body.  Correct posture must be practiced with all of your activities, especially prolonged sitting and standing. Correct posture is as important when doing repetitive low-stress activities (typing) as it is when doing a single heavy-load activity (lifting). PROLONGED STANDING WHILE SLIGHTLY LEANING FORWARD When completing a task that requires you to lean forward while standing in one place for a long time, place either foot up on a stationary 2- to 4-inch high object to help maintain the best posture. When both feet are on the ground, the low back tends to lose its slight inward curve. If this curve flattens (or becomes too large), then the back and your other joints will experience too much stress, fatigue more quickly, and can cause pain.  RESTING POSITIONS Consider which positions are most painful for you when choosing a resting position. If you have pain with flexion-based activities (sitting, bending, stooping, squatting), choose a position that allows you to rest in a less flexed posture. You would want to avoid curling into a fetal position on your side. If your pain worsens with extension-based activities (prolonged standing, working overhead), avoid resting in an extended position such as sleeping on your stomach. Most people will find more comfort when they rest with their spine in a more neutral position, neither too rounded nor too arched. Lying on a non-sagging bed on your side with a pillow between your knees, or on your back with a pillow under your knees will often provide some relief. Keep in mind, being in any one position for a prolonged period of time, no matter how correct your posture, can still lead to  stiffness. WALKING Walk with an upright posture. Your ears, shoulders, and hips should all line up. OFFICE WORK When working at a desk, create an environment that supports good, upright posture. Without extra support, muscles fatigue and lead to excessive strain on joints and other tissues. CHAIR:  A chair should be able to slide under your desk when your back makes contact with the back of the chair. This allows you to work closely.  The chair's height should allow your eyes to be level with the upper part of your monitor and your hands to be slightly lower than your elbows.  Body position:  Your feet should make contact with the floor. If this is not possible, use a foot rest.  Keep your ears over your shoulders. This will reduce stress on your neck and low back.   This information is not intended to replace advice given to you by your health care provider. Make sure you discuss any questions you have with your health care provider.   Document Released: 12/31/2004 Document Revised: 01/21/2014 Document Reviewed: 04/14/2008  Chartered certified accountant Patient Education [employer].

## 2015-08-23 ENCOUNTER — Ambulatory Visit: Payer: BLUE CROSS/BLUE SHIELD

## 2015-08-24 ENCOUNTER — Ambulatory Visit (INDEPENDENT_AMBULATORY_CARE_PROVIDER_SITE_OTHER): Payer: BLUE CROSS/BLUE SHIELD | Admitting: Osteopathic Medicine

## 2015-08-24 VITALS — BP 104/68 | HR 96 | Wt 141.0 lb

## 2015-08-24 DIAGNOSIS — I10 Essential (primary) hypertension: Secondary | ICD-10-CM

## 2015-08-24 MED ORDER — LOSARTAN POTASSIUM-HCTZ 50-12.5 MG PO TABS: 1.0000 | ORAL_TABLET | Freq: Every day | ORAL | 3 refills | Status: DC

## 2015-08-24 NOTE — Progress Notes (Signed)
Called son, Alinda Moneyony, advised mother will need a follow up in 2 weeks. Verbalized understanding. He will contact clinic to schedule.

## 2015-08-24 NOTE — Progress Notes (Signed)
  Blood pressure was a bit low, previously in the office this patient had not been taking her blood pressure medicine prior to her visits, she was instructed to come back today taking her pressure medications as normal just for nurse visit to see what pressure was. I sent a new medication, plan on office visit in 2 weeks with nurse to recheck pressure

## 2015-08-24 NOTE — Progress Notes (Signed)
Patient came into clinic today for a BP check, accompanied by her son to translate. Pt BP was elevated at last OV and some medications were changed. She brought her bottles into clinic today, PCP reviewed them. It was decided to stop the Hyzaar and a new Rx will be sent to pharmacy. Hyzaar Rx was discarded here in office. Pt and son verbalized understanding to pick up and begin new Rx.

## 2015-08-29 ENCOUNTER — Other Ambulatory Visit: Payer: Self-pay

## 2015-08-29 DIAGNOSIS — I10 Essential (primary) hypertension: Secondary | ICD-10-CM

## 2015-08-29 MED ORDER — LOSARTAN POTASSIUM-HCTZ 50-12.5 MG PO TABS: 1.0000 | ORAL_TABLET | Freq: Every day | ORAL | 0 refills | Status: DC

## 2015-08-29 NOTE — Telephone Encounter (Signed)
PATIENT REQUEST A 90 DAY SUPPLY OF LOSARTAN 50-12.5 MG. IT WAS APPROVED AND SENT TO PATIENT PHARMACY. Rhonda Cunningham,CMA

## 2015-09-21 ENCOUNTER — Ambulatory Visit (INDEPENDENT_AMBULATORY_CARE_PROVIDER_SITE_OTHER): Payer: BLUE CROSS/BLUE SHIELD | Admitting: Osteopathic Medicine

## 2015-09-21 DIAGNOSIS — Z23 Encounter for immunization: Secondary | ICD-10-CM

## 2015-09-21 NOTE — Progress Notes (Signed)
Nurse notes reviewed. Nothing to add. BP 125/72 (BP Location: Right Arm, Patient Position: Sitting, Cuff Size: Large)   Pulse 84   Temp 98.2 F (36.8 C) (Oral)   Resp 16   Wt 144 lb (65.3 kg)   SpO2 100%   BMI 23.60 kg/m

## 2015-09-21 NOTE — Progress Notes (Signed)
Patient is here for BP follow up after medication changes. She is speaking through her son who is interpreting: denies any untoward side effects of new medication; no dizziness, headaches, shortness of breath. She would like to have Flu vaccination today.  Per Dr. Lyn HollingsheadAlexander, she will return in 6 months.

## 2015-12-14 ENCOUNTER — Other Ambulatory Visit: Payer: Self-pay | Admitting: Osteopathic Medicine

## 2015-12-14 DIAGNOSIS — M542 Cervicalgia: Secondary | ICD-10-CM

## 2016-01-03 ENCOUNTER — Other Ambulatory Visit: Payer: Self-pay | Admitting: Osteopathic Medicine

## 2016-01-03 DIAGNOSIS — M542 Cervicalgia: Secondary | ICD-10-CM

## 2016-02-07 ENCOUNTER — Encounter: Payer: BLUE CROSS/BLUE SHIELD | Admitting: Osteopathic Medicine

## 2016-02-13 ENCOUNTER — Ambulatory Visit (INDEPENDENT_AMBULATORY_CARE_PROVIDER_SITE_OTHER): Payer: BLUE CROSS/BLUE SHIELD | Admitting: Osteopathic Medicine

## 2016-02-13 ENCOUNTER — Encounter: Payer: Self-pay | Admitting: Osteopathic Medicine

## 2016-02-13 VITALS — BP 135/91 | HR 84 | Ht 66.25 in | Wt 140.0 lb

## 2016-02-13 DIAGNOSIS — E785 Hyperlipidemia, unspecified: Secondary | ICD-10-CM

## 2016-02-13 DIAGNOSIS — Z0000 Encounter for general adult medical examination without abnormal findings: Secondary | ICD-10-CM

## 2016-02-13 DIAGNOSIS — E559 Vitamin D deficiency, unspecified: Secondary | ICD-10-CM

## 2016-02-13 DIAGNOSIS — Z1211 Encounter for screening for malignant neoplasm of colon: Secondary | ICD-10-CM | POA: Diagnosis not present

## 2016-02-13 DIAGNOSIS — I10 Essential (primary) hypertension: Secondary | ICD-10-CM | POA: Diagnosis not present

## 2016-02-13 DIAGNOSIS — M810 Age-related osteoporosis without current pathological fracture: Secondary | ICD-10-CM | POA: Diagnosis not present

## 2016-02-13 DIAGNOSIS — E876 Hypokalemia: Secondary | ICD-10-CM

## 2016-02-13 MED ORDER — POTASSIUM CHLORIDE CRYS ER 10 MEQ PO TBCR: 10.0000 meq | EXTENDED_RELEASE_TABLET | Freq: Two times a day (BID) | ORAL | 3 refills | Status: DC

## 2016-02-13 MED ORDER — VITAMIN D (ERGOCALCIFEROL) 1.25 MG (50000 UNIT) PO CAPS: 50000.0000 [IU] | ORAL_CAPSULE | ORAL | 2 refills | Status: DC

## 2016-02-13 NOTE — Progress Notes (Signed)
HPI: Katrina Baird is a 66 y.o. female  who [facility], 02/13/16,  for chief complaint of:  Chief Complaint  Patient presents with  . Annual Exam    No complaints today No questions See below for review of preventive care  Labs discussed: Potassium a bit low Vitamin D deficiency  Cholesterol OK  [employer] ID# [id number]    Past medical, surgical, social and family history reviewed: Patient Active Problem List   Diagnosis Date Noted  . Osteoporosis 02/03/2015  . Axillary lump 01/31/2015  . Colon cancer screening 03/30/2014  . Visit for screening mammogram 03/08/2013  . Insomnia 03/08/2013  . Routine general medical examination at a health care facility 03/08/2013  . Thoracic back pain 09/23/2012  . Essential hypertension, benign 09/23/2012  . Osteoporosis, unspecified 09/23/2012  . Hyperlipidemia with target LDL less than 130 09/23/2012   No past surgical history on file. Social History  Substance Use Topics  . Smoking status: Never Smoker  . Smokeless tobacco: Never Used  . Alcohol use No   Family History  Problem Relation Age of Onset  . Cancer Neg Hx   . Diabetes Neg Hx   . Early death Neg Hx   . Heart disease Neg Hx   . Hyperlipidemia Neg Hx   . Hypertension Neg Hx   . Kidney disease Neg Hx   . Stroke Neg Hx      Current medication list and allergy/intolerance information reviewed:   Current Outpatient Prescriptions  Medication Sig Dispense Refill  . alendronate (FOSAMAX) 10 MG tablet Take 1 tablet (10 mg total) by mouth daily before breakfast. 90 tablet 3  . atorvastatin (LIPITOR) 80 MG tablet Take 1 tablet (80 mg total) by mouth daily. 90 tablet 1  . CALCIUM 600-D 600-400 MG-UNIT TABS Take 2 tablets by mouth daily.  1  . losartan-hydrochlorothiazide (HYZAAR) 50-12.5 MG tablet Take 1 tablet by mouth daily. 90 tablet 0  . naproxen (NAPROSYN) 375 MG tablet TAKE 1 TABLET (375 MG TOTAL) BY MOUTH 2  (TWO) TIMES DAILY WITH A MEAL. AS NEEDED FOR NECK PAIN 30 tablet 0  . hydrocortisone 2.5 % ointment APPLY TOPICALLY TWICE A DAY AS NEEDED FOR ITCHING,USE ONLY AS NEEDED CAN CAUASE WHITE/THIN SKIN  1   No current facility-administered medications for this visit.    No Known Allergies    Review of Systems:  Constitutional:  No recent illness, feels well today  HEENT: No  headache, no vision change  Cardiac: No  chest pain  Respiratory:  No  shortness of breath. No  Cough  Gastrointestinal: No  abdominal pain, no blood in stool  Musculoskeletal: Occasional LBP  Skin: No  Rash  Psychiatric: No  concerns with depression, No  concerns with anxiety  Exam:  BP (!) 135/91   Pulse 84   Ht 5' 6.25" (1.683 m)   Wt 140 lb (63.5 kg)   SpO2 100%   BMI 22.43 kg/m   Constitutional: VS see above. General Appearance: alert, well-developed, well-nourished, NAD  Eyes: Normal lids and conjunctive, non-icteric sclera  Ears, Nose, Mouth, Throat: MMM, Normal external inspection ears/nares/mouth/lips/gums. TM normal bilaterally. Pharynx/tonsils no erythema, no exudate. Nasal mucosa normal.   Neck: No masses, trachea midline. No thyroid enlargement. No tenderness/mass appreciated. No lymphadenopathy  Respiratory: Normal respiratory effort. no wheeze, no rhonchi, no rales  Cardiovascular: S1/S2 normal, no murmur, no rub/gallop auscultated. RRR. No lower extremity edema  Gastrointestinal: Nontender, no masses. No hepatomegaly,  no splenomegaly. No hernia appreciated. Bowel sounds normal. Rectal exam deferred.   Musculoskeletal: Gait normal. No clubbing/cyanosis of digits.   Neurological: Normal balance/coordination. No tremor.   Skin: warm, dry, intact. No rash/ulcer.   Psychiatric: Normal judgment/insight. Normal mood and affect. Oriented x3.     ASSESSMENT/PLAN:   Annual physical exam  Essential hypertension, benign  Osteoporosis without current pathological fracture, unspecified  osteoporosis type  Colon cancer screening  Hyperlipidemia with target LDL less than 130  Hypokalemia - Plan: potassium chloride (K-DUR,KLOR-CON) 10 MEQ tablet, BASIC METABOLIC PANEL WITH GFR  Vitamin D deficiency - Plan: Vitamin D, Ergocalciferol, (DRISDOL) 50000 units CAPS capsule, VITAMIN D 25 Hydroxy (Vit-D Deficiency, Fractures)    FEMALE PREVENTIVE CARE Updated 02/13/16   ANNUAL SCREENING/COUNSELING  Diet/Exercise - HEALTHY HABITS DISCUSSED TO DECREASE CV RISK History  Smoking Status  . Never Smoker  Smokeless Tobacco  . Never Used   History  Alcohol Use No   Depression screen PHQ 2/9 02/13/2016  Decreased Interest 0  Down, Depressed, Hopeless 0  PHQ - 2 Score 0    Domestic violence concerns - no  HTN SCREENING - SEE VITALS  INFECTIOUS DISEASE SCREENING  HIV - does not need  GC/CT - does not need  HepC - DOB [birth year] - does not need  TB - does not need  DISEASE SCREENING  Lipid - does not need  DM2 - does not need  Osteoporosis - women age 51+ - does not need  CANCER SCREENING  Cervical - does not need  Breast - does not need  Lung - does not need  Colon - needs - discussed colonoscopy, pt will think about it  ADULT VACCINATION  Influenza - annual vaccine recommended  Td - booster every 10 years   Zoster - option at 17, yes at 73+ - has had  PCV13 - has had  PPSV23 - has had Immunization History  Administered Date(s) Administered  . Influenza, High Dose Seasonal PF 11/02/2012  . Influenza,inj,Quad PF,36+ Mos 11/08/2013, 01/31/2015, 09/21/2015  . Pneumococcal Conjugate-13 03/30/2014  . Pneumococcal Polysaccharide-23 03/08/2013  . Tdap 11/02/2012  . Zoster 03/08/2013     OTHER  Fall - exercise and Vit D age 51+ - needs  Consider ASA - age 51-59 - does not need      Patient Instructions  Colorectal Cancer Screening Colorectal cancer screening is a group of tests used to check for colorectal cancer. Colorectal refers  to your colon and rectum. Your colon and rectum are located at the end of your large intestine and carry your bowel movements out of your body. Why is colorectal cancer screening done? It is common for abnormal growths (polyps) to form in the lining of your colon, especially as you get older. These polyps can be cancerous or become cancerous. If colorectal cancer is found at an early stage, it is treatable. Who should be screened for colorectal cancer? Screening is recommended for all adults at average risk starting at age 13. Tests may be recommended every 1 to 10 years. Your health care provider may recommend earlier or more frequent screening if you have:  A history of colorectal cancer or polyps.  A family member with a history of colorectal cancer or polyps.  Inflammatory bowel disease, such as ulcerative colitis or Crohn disease.  A type of hereditary colon cancer syndrome.  Colorectal cancer symptoms. Types of screening tests There are several types of colorectal screening tests. They include:  Fecal immunochemical test (FIT) aka Cologuard  Colonoscopy. During this test, a colonoscope is used to examine your entire colon. A colonoscope is a long, thin, flexible tube with a camera. This test examines your entire colon and rectum. This information is not intended to replace advice given to you by your health care provider. Make sure you discuss any questions you have with your health care provider. Document Released: 06/20/2009 Document Revised: 08/10/2015 Document Reviewed: 04/08/2013 Elsevier Interactive Patient Education  2017 Elsevier Inc.     Visit summary with medication list and pertinent instructions was printed for patient to review. All questions at time of visit were answered - patient instructed to contact office with any additional concerns. ER/RTC precautions were reviewed with the patient. Follow-up plan: Return for labs in 2 months, blood pressure follow-up visit  with Dr. Lyn Hollingshead in 6 months.

## 2016-02-13 NOTE — Patient Instructions (Signed)
Colorectal Cancer Screening Colorectal cancer screening is a group of tests used to check for colorectal cancer. Colorectal refers to your colon and rectum. Your colon and rectum are located at the end of your large intestine and carry your bowel movements out of your body. Why is colorectal cancer screening done? It is common for abnormal growths (polyps) to form in the lining of your colon, especially as you get older. These polyps can be cancerous or become cancerous. If colorectal cancer is found at an early stage, it is treatable. Who should be screened for colorectal cancer? Screening is recommended for all adults at average risk starting at age 68. Tests may be recommended every 1 to 10 years. Your health care provider may recommend earlier or more frequent screening if you have:  A history of colorectal cancer or polyps.  A family member with a history of colorectal cancer or polyps.  Inflammatory bowel disease, such as ulcerative colitis or Crohn disease.  A type of hereditary colon cancer syndrome.  Colorectal cancer symptoms. Types of screening tests There are several types of colorectal screening tests. They include:  Fecal immunochemical test (FIT) aka Cologuard  Colonoscopy. During this test, a colonoscope is used to examine your entire colon. A colonoscope is a long, thin, flexible tube with a camera. This test examines your entire colon and rectum. This information is not intended to replace advice given to you by your health care provider. Make sure you discuss any questions you have with your health care provider. Document Released: 06/20/2009 Document Revised: 08/10/2015 Document Reviewed: 04/08/2013 Elsevier Interactive Patient Education  2017 [employer]

## 2016-02-29 DIAGNOSIS — E559 Vitamin D deficiency, unspecified: Secondary | ICD-10-CM | POA: Diagnosis not present

## 2016-02-29 DIAGNOSIS — E876 Hypokalemia: Secondary | ICD-10-CM | POA: Diagnosis not present

## 2016-02-29 LAB — BASIC METABOLIC PANEL WITH GFR
BUN: 25 mg/dL (ref 7–25)
CHLORIDE: 101 mmol/L (ref 98–110)
CO2: 27 mmol/L (ref 20–31)
CREATININE: 0.91 mg/dL (ref 0.50–0.99)
Calcium: 9.2 mg/dL (ref 8.6–10.4)
GFR, Est African American: 75 mL/min (ref >=60)
GFR, Est Non African American: 65 mL/min (ref >=60)
Glucose, Bld: 102 mg/dL — ABNORMAL HIGH (ref 65–99)
Potassium: 3.8 mmol/L (ref 3.5–5.3)
Sodium: 141 mmol/L (ref 135–146)

## 2016-03-01 ENCOUNTER — Encounter: Payer: Self-pay | Admitting: Osteopathic Medicine

## 2016-03-01 LAB — VITAMIN D 25 HYDROXY (VIT D DEFICIENCY, FRACTURES): VIT D 25 HYDROXY: 62 ng/mL (ref 30–100)

## 2016-03-13 ENCOUNTER — Other Ambulatory Visit: Payer: Self-pay

## 2016-03-13 DIAGNOSIS — E876 Hypokalemia: Secondary | ICD-10-CM

## 2016-03-13 MED ORDER — POTASSIUM CHLORIDE CRYS ER 10 MEQ PO TBCR: 10.0000 meq | EXTENDED_RELEASE_TABLET | Freq: Two times a day (BID) | ORAL | 3 refills | Status: DC

## 2016-03-13 NOTE — Telephone Encounter (Signed)
Patient request a 90 day supply of Klor-Con 10 MEQ. #[id number] refills sent to [facility]. Katrina Baird,CMA

## 2016-03-21 ENCOUNTER — Other Ambulatory Visit: Payer: Self-pay | Admitting: Osteopathic Medicine

## 2016-03-21 DIAGNOSIS — I10 Essential (primary) hypertension: Secondary | ICD-10-CM

## 2016-04-30 ENCOUNTER — Other Ambulatory Visit: Payer: Self-pay | Admitting: Osteopathic Medicine

## 2016-04-30 DIAGNOSIS — I10 Essential (primary) hypertension: Secondary | ICD-10-CM

## 2016-06-18 ENCOUNTER — Other Ambulatory Visit: Payer: Self-pay | Admitting: Osteopathic Medicine

## 2016-06-18 DIAGNOSIS — I10 Essential (primary) hypertension: Secondary | ICD-10-CM

## 2016-06-27 ENCOUNTER — Other Ambulatory Visit: Payer: Self-pay | Admitting: Osteopathic Medicine

## 2016-06-27 DIAGNOSIS — E559 Vitamin D deficiency, unspecified: Secondary | ICD-10-CM

## 2016-07-19 ENCOUNTER — Other Ambulatory Visit: Payer: Self-pay

## 2016-07-19 DIAGNOSIS — I10 Essential (primary) hypertension: Secondary | ICD-10-CM

## 2016-07-19 MED ORDER — LOSARTAN POTASSIUM-HCTZ 50-12.5 MG PO TABS: 1.0000 | ORAL_TABLET | Freq: Every day | ORAL | 0 refills | Status: DC

## 2016-07-24 ENCOUNTER — Other Ambulatory Visit: Payer: Self-pay | Admitting: Osteopathic Medicine

## 2016-07-24 DIAGNOSIS — M810 Age-related osteoporosis without current pathological fracture: Secondary | ICD-10-CM

## 2016-07-30 ENCOUNTER — Ambulatory Visit (INDEPENDENT_AMBULATORY_CARE_PROVIDER_SITE_OTHER): Payer: BLUE CROSS/BLUE SHIELD | Admitting: Osteopathic Medicine

## 2016-07-30 ENCOUNTER — Encounter: Payer: Self-pay | Admitting: Osteopathic Medicine

## 2016-07-30 DIAGNOSIS — I10 Essential (primary) hypertension: Secondary | ICD-10-CM | POA: Diagnosis not present

## 2016-07-30 DIAGNOSIS — E876 Hypokalemia: Secondary | ICD-10-CM

## 2016-07-30 LAB — BASIC METABOLIC PANEL WITH GFR
BUN: 15 mg/dL (ref 7–25)
CHLORIDE: 100 mmol/L (ref 98–110)
CO2: 30 mmol/L (ref 20–31)
Calcium: 9.2 mg/dL (ref 8.6–10.4)
Creat: 0.83 mg/dL (ref 0.50–0.99)
GFR, EST NON AFRICAN AMERICAN: 73 mL/min (ref >=60)
GFR, Est African American: 84 mL/min (ref >=60)
GLUCOSE: 101 mg/dL — AB (ref 65–99)
POTASSIUM: 3.4 mmol/L — AB (ref 3.5–5.3)
SODIUM: 139 mmol/L (ref 135–146)

## 2016-07-30 MED ORDER — POTASSIUM CHLORIDE CRYS ER 10 MEQ PO TBCR: 10.0000 meq | EXTENDED_RELEASE_TABLET | Freq: Two times a day (BID) | ORAL | 3 refills | Status: DC

## 2016-07-30 MED ORDER — LOSARTAN POTASSIUM-HCTZ 50-12.5 MG PO TABS: 1.0000 | ORAL_TABLET | Freq: Every day | ORAL | 3 refills | Status: DC

## 2016-07-30 NOTE — Progress Notes (Signed)
HPI: Katrina Baird is a 66 y.o. female  who presents to [facility] today, 07/30/16,  for chief complaint of:  Chief Complaint  Patient presents with  . Hypertension    . HTN: slight overdue for follow-up so we only sent 30 days meds 07/19/16, labs were ok on recheck in 02/2016.   ?patient and family are unsure if taking/need refill on potassium  Accompanied by son who assists with history-taking   Past medical history, surgical history, social history and family history reviewed.  Patient Active Problem List   Diagnosis Date Noted  . Osteoporosis 02/03/2015  . Axillary lump 01/31/2015  . Colon cancer screening 03/30/2014  . Visit for screening mammogram 03/08/2013  . Insomnia 03/08/2013  . Routine general medical examination at a health care facility 03/08/2013  . Thoracic back pain 09/23/2012  . Essential hypertension, benign 09/23/2012  . Osteoporosis, unspecified 09/23/2012  . Hyperlipidemia with target LDL less than 130 09/23/2012    Current medication list and allergy/intolerance information reviewed.   Current Outpatient Prescriptions on File Prior to Visit  Medication Sig Dispense Refill  . alendronate (FOSAMAX) 10 MG tablet TAKE 1 TABLET (10 MG TOTAL) BY MOUTH DAILY BEFORE BREAKFAST. 90 tablet 3  . atorvastatin (LIPITOR) 80 MG tablet TAKE 1 TABLET (80 MG TOTAL) BY MOUTH DAILY. 90 tablet 1  . hydrocortisone 2.5 % ointment APPLY TOPICALLY TWICE A DAY AS NEEDED FOR ITCHING,USE ONLY AS NEEDED CAN CAUASE WHITE/THIN SKIN  1  . losartan-hydrochlorothiazide (HYZAAR) 50-12.5 MG tablet Take 1 tablet by mouth daily. PT NEEDS A FOLLOW-UP FOR FURTHER REFILLS. 30 tablet 0  . naproxen (NAPROSYN) 375 MG tablet TAKE 1 TABLET (375 MG TOTAL) BY MOUTH 2 (TWO) TIMES DAILY WITH A MEAL. AS NEEDED FOR NECK PAIN 30 tablet 0  . potassium chloride (K-DUR,KLOR-CON) 10 MEQ tablet Take 1 tablet (10 mEq total) by mouth 2 (two) times daily. 180 tablet 3  . Vitamin D,  Ergocalciferol, (DRISDOL) 50000 units CAPS capsule Take 1 capsule (50,000 Units total) by mouth every 7 (seven) days. 8 capsule 2   No current facility-administered medications on file prior to visit.    No Known Allergies    Review of Systems:  Constitutional: No recent illness, feeling well today   HEENT: No  headache  Cardiac: No  chest pain, No  pressure, No palpitations  Respiratory:  No  shortness of breath.    Exam:  BP 130/86   Pulse 68   Wt 139 lb (63 kg)   SpO2 97%   BMI 22.27 kg/m   Constitutional: VS see above. General Appearance: alert, well-developed, well-nourished, NAD  Neck: No masses, trachea midline.   Respiratory: Normal respiratory effort. no wheeze, no rhonchi, no rales  Cardiovascular: S1/S2 normal, no murmur, no rub/gallop auscultated. RRR.   Musculoskeletal: Gait normal.   Neurological: Normal balance/coordination.    Psychiatric: Normal mood and affect.     ASSESSMENT/PLAN: Will recheck K and if fine can probably d/c or reduce K meds, doesn't sound like she's taking these consistently   Essential hypertension - Plan: losartan-hydrochlorothiazide (HYZAAR) 50-12.5 MG tablet, BASIC METABOLIC PANEL WITH GFR  Hypokalemia - Plan: potassium chloride (K-DUR,KLOR-CON) 10 MEQ tablet      Follow-up plan: Return in about 6 months (around 01/30/2017) for annual check-up and follow-up blood pressure .  Visit summary with medication list and pertinent instructions was printed for patient to review, alert us if any changes needed. All questions at time of visit were  answered - patient instructed to contact office with any additional concerns. ER/RTC precautions were reviewed with the patient and understanding verbalized.

## 2016-09-06 IMAGING — US US MISC SOFT TISSUE
1 series · 14 of 16 positions shown · non-contrast
Comparison: None.

CLINICAL DATA: Complains of bilateral maxillary soft tissue lumps
for 10 years.

EXAM:
SOFT TISSUE ULTRASOUND - BILATERAL AXILLA
TECHNIQUE: Soft tissue scanning limited to the axilla bilaterally.

[Series 1: us misc soft tissue · 0.08mm/px · 14 of 35 slices shown]
[im 1/35]
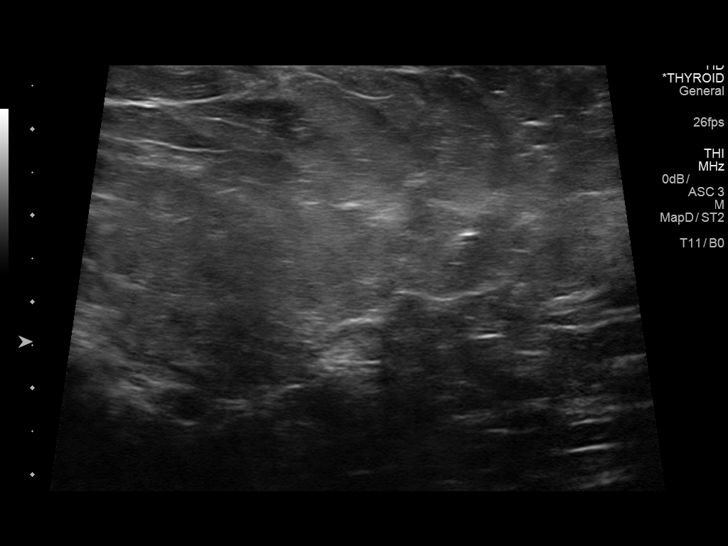
[im 3/35]
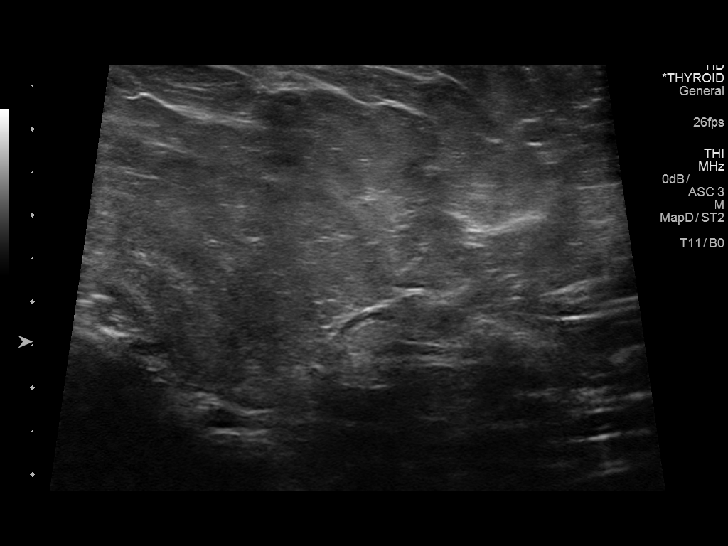
[im 5/35]
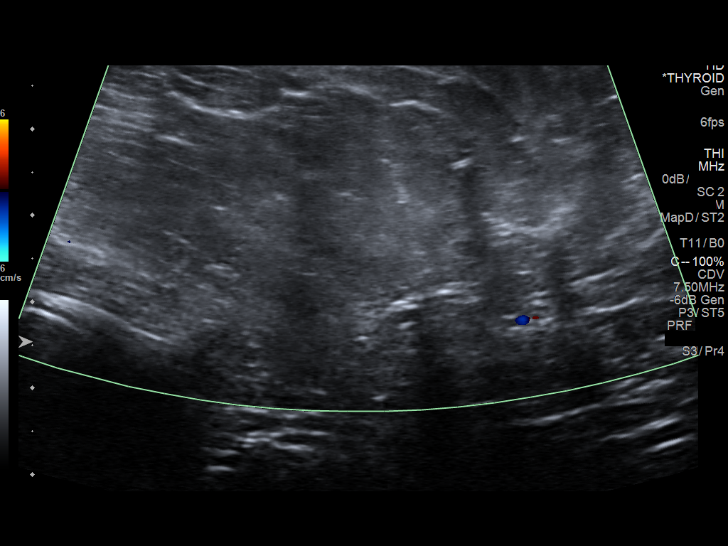
[im 10/35]
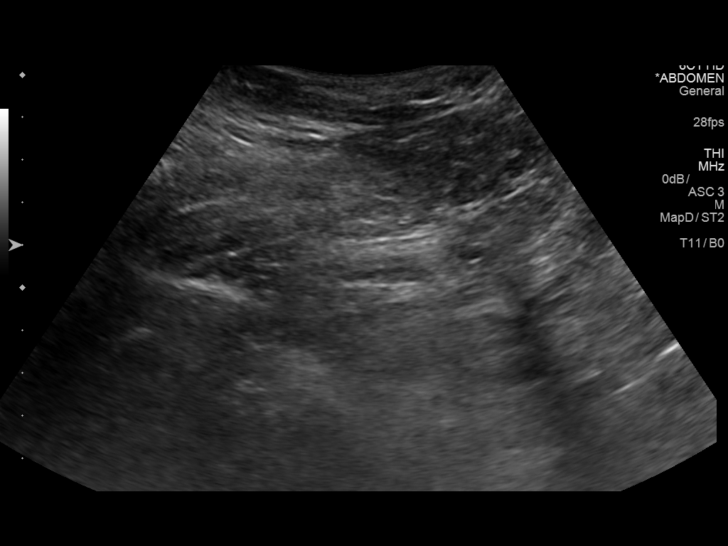
[im 12/35]
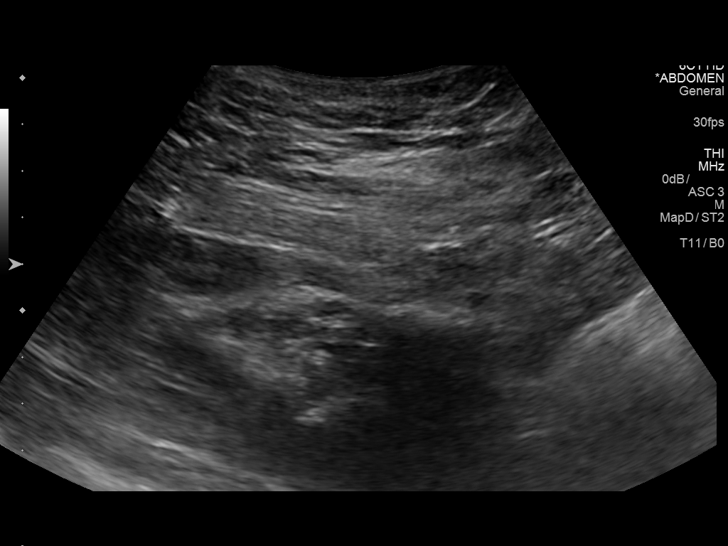
[im 14/35]
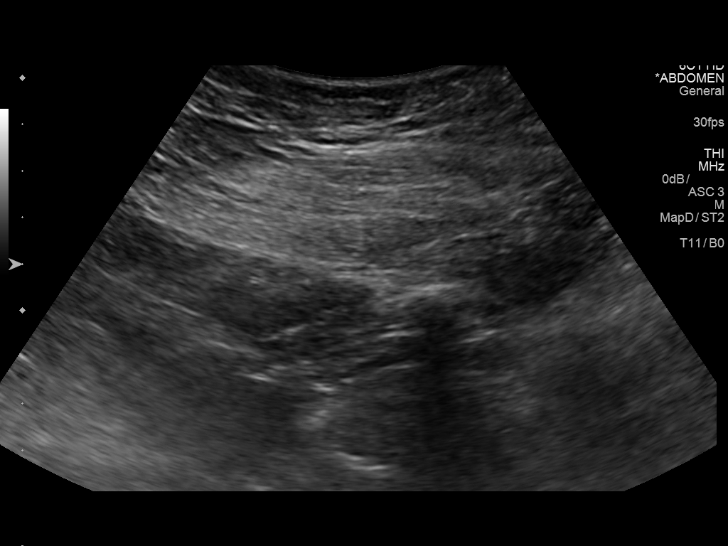
[im 16/35]
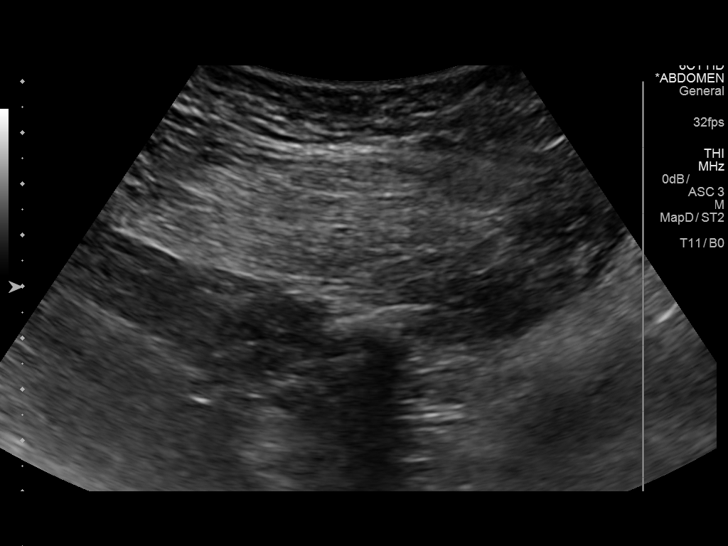
[im 19/35]
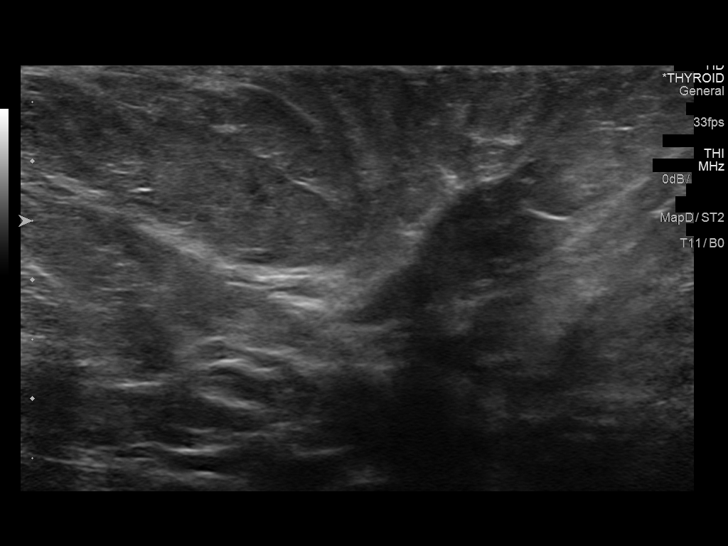
[im 21/35]
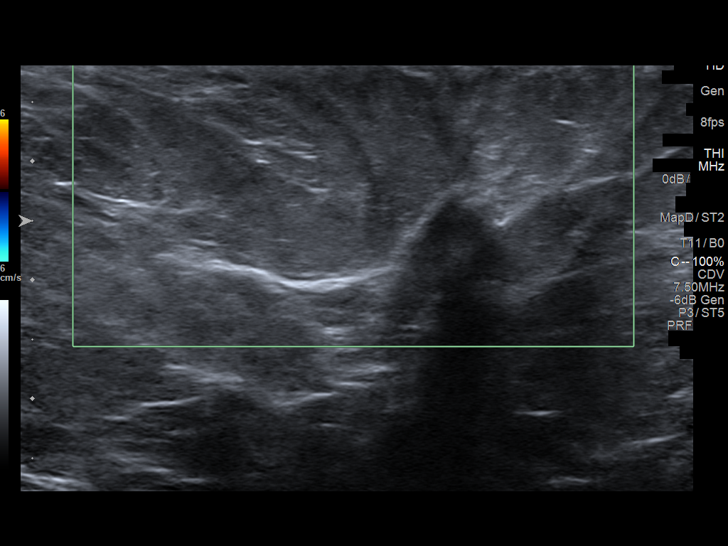
[im 23/35]
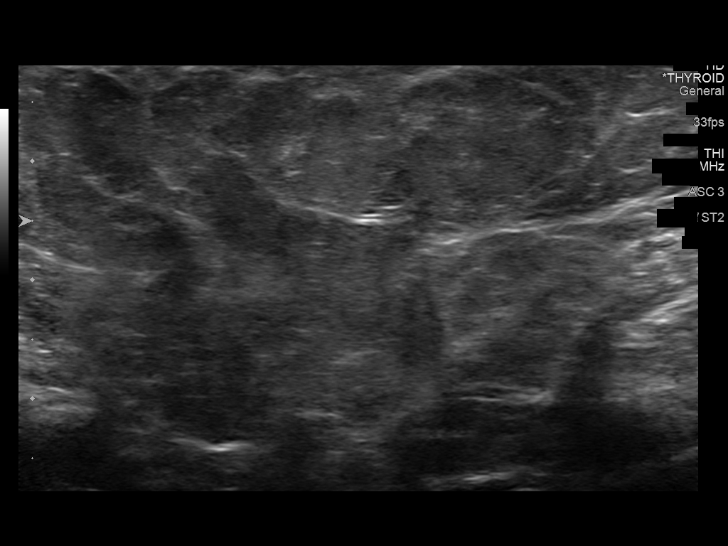
[im 28/35]
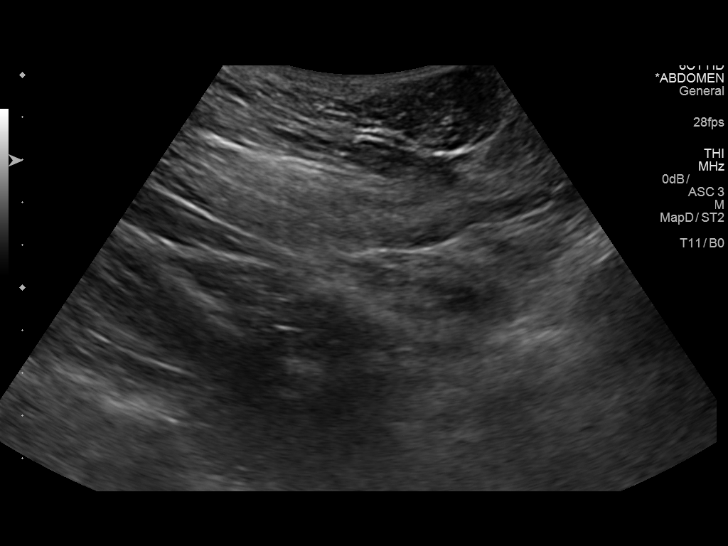
[im 30/35]
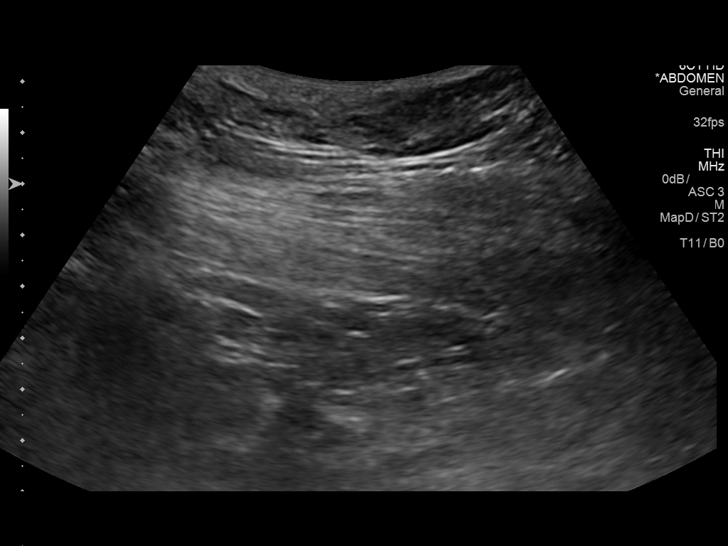
[im 32/35]
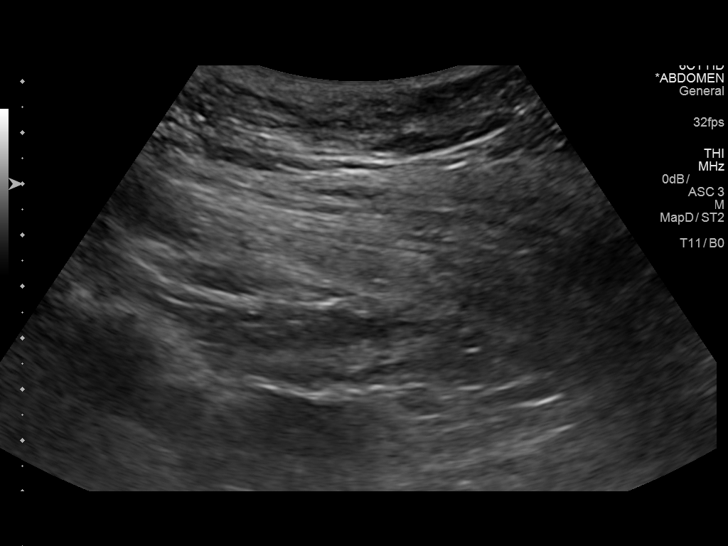
[im 35/35]
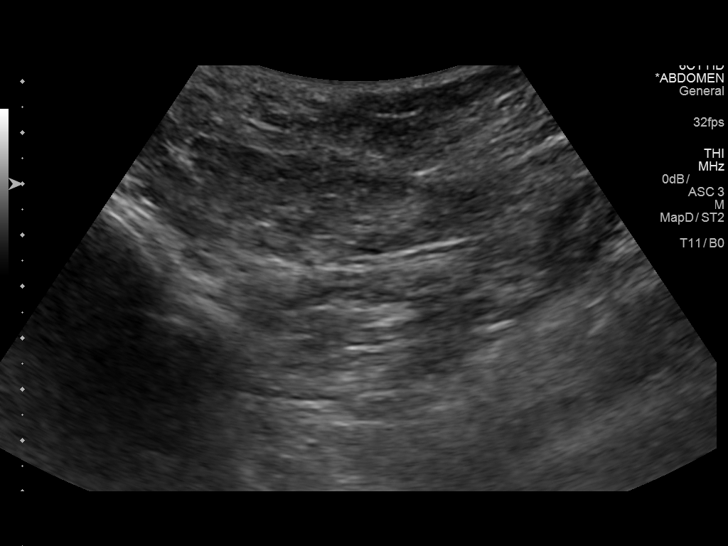

[14 of 16 positions shown; findings below may reference images not displayed]

FINDINGS: Negative for mass or adenopathy. No cystic or solid mass in the
axilla bilaterally. Normal appearing fatty tissue is noted in the
axilla bilaterally.
IMPRESSION: Negative for mass or adenopathy in the axilla bilaterally.

## 2016-10-10 ENCOUNTER — Encounter: Payer: Self-pay | Admitting: Osteopathic Medicine

## 2016-10-10 ENCOUNTER — Ambulatory Visit (INDEPENDENT_AMBULATORY_CARE_PROVIDER_SITE_OTHER): Payer: BLUE CROSS/BLUE SHIELD | Admitting: Osteopathic Medicine

## 2016-10-10 VITALS — BP 138/86 | HR 75 | Ht 65.0 in | Wt 134.0 lb

## 2016-10-10 DIAGNOSIS — R0789 Other chest pain: Secondary | ICD-10-CM

## 2016-10-10 DIAGNOSIS — S29012A Strain of muscle and tendon of back wall of thorax, initial encounter: Secondary | ICD-10-CM | POA: Diagnosis not present

## 2016-10-10 MED ORDER — DICLOFENAC SODIUM 1 % TD GEL: 2.0000 g | Freq: Four times a day (QID) | TRANSDERMAL | 11 refills | Status: DC

## 2016-10-10 NOTE — Progress Notes (Signed)
HPI: Katrina Baird is a 66 y.o. female  who presents to [facility] today, 10/10/16,  for chief complaint of:  Chief Complaint  Patient presents with  . Follow-up    BACK PAIN IS BETTER  . Shoulder Pain    LEFT    Shoulder pain: Left thigh between shoulder blade and spine, sore over the past few days, was pretty bad yesterday but she took to naproxen and today it is much better. When the pain is bad it sometimes radiates around the rib cage and toward the front of the chest, she does not have any palpitations/pressure, no shortness of breath. No tearing/ripping sensation.   Patient is accompanied by son who assists with history-taking.   Past medical history, surgical history, social history and family history reviewed.  Patient Active Problem List   Diagnosis Date Noted  . Osteoporosis 02/03/2015  . Axillary lump 01/31/2015  . Colon cancer screening 03/30/2014  . Visit for screening mammogram 03/08/2013  . Insomnia 03/08/2013  . Routine general medical examination at a health care facility 03/08/2013  . Thoracic back pain 09/23/2012  . Essential hypertension, benign 09/23/2012  . Osteoporosis, unspecified 09/23/2012  . Hyperlipidemia with target LDL less than 130 09/23/2012    Current medication list and allergy/intolerance information reviewed.   Current Outpatient Prescriptions on File Prior to Visit  Medication Sig Dispense Refill  . alendronate (FOSAMAX) 10 MG tablet TAKE 1 TABLET (10 MG TOTAL) BY MOUTH DAILY BEFORE BREAKFAST. 90 tablet 3  . atorvastatin (LIPITOR) 80 MG tablet TAKE 1 TABLET (80 MG TOTAL) BY MOUTH DAILY. 90 tablet 1  . losartan-hydrochlorothiazide (HYZAAR) 50-12.5 MG tablet Take 1 tablet by mouth daily. 90 tablet 3  . potassium chloride (K-DUR,KLOR-CON) 10 MEQ tablet Take 1 tablet (10 mEq total) by mouth 2 (two) times daily. 180 tablet 3   No current facility-administered medications on file prior to visit.    No Known  Allergies    Review of Systems:  Constitutional: No recent illness  HEENT: No  headache, no vision change  Cardiac: +chest pain as per HPI, No  pressure, No palpitations  Respiratory:  No  shortness of breath. No  Cough  Gastrointestinal: No  abdominal pain, no change on bowel habits  Musculoskeletal: +new myalgia/arthralgia  Skin: No  Rash  Neurologic: No  weakness, No  Dizziness  Exam:  BP 138/86   Pulse 75   Ht  (1.651 m)   Wt 134 lb (60.8 kg)   BMI 22.30 kg/m   Constitutional: VS see above. General Appearance: alert, well-developed, well-nourished, NAD  Eyes: Normal lids and conjunctive, non-icteric sclera  Ears, Nose, Mouth, Throat: MMM, Normal external inspection ears/nares/mouth/lips/gums.  Neck: No masses, trachea midline.   Respiratory: Normal respiratory effort. no wheeze, no rhonchi, no rales  Cardiovascular: S1/S2 normal, no murmur, no rub/gallop auscultated. RRR.   Musculoskeletal: Gait normal. Symmetric and independent movement of all extremities. Tenderness to palpation left rhomboid/thoracic paraspinals, tenderness to palpation costochondral junction ribs 3 through 5 approximately areas normal shoulder joint range of motion. No spinal midline tenderness.  Neurological: Normal balance/coordination. No tremor.  Skin: warm, dry, intact.   Psychiatric: Normal judgment/insight. Normal mood and affect. Oriented x3.    EKG interpretation: Rate: 69  Rhythm: sinus RSR V2 slightly No ST/T changes concerning for acute ischemia/infarct     ASSESSMENT/PLAN:   Strain of rhomboid muscle, initial encounter - Plan: diclofenac sodium (VOLTAREN) 1 % GEL  Chest discomfort  Patient Instructions  I think this pain is most likely explained by muscle spasm rather than something more serious like a heart problem.  However, if pain gets worse or changes, please seek emergency medical care.  Can take the naproxen as directed on the prescription bottle,  if pain persists, can add Tylenol/acetaminophen 1000 mg 4 times per day, can use these along with the Voltaren/diclofenac gel prescribed today.     Follow-up plan: Return if symptoms worsen or fail to improve.  Visit summary with medication list and pertinent instructions was printed for patient to review - spent some time reviewing it with her son. Advised to alert [place] if any changes needed. All questions at time of visit were answered - patient instructed to contact office with any additional concerns. ER/RTC precautions were reviewed with the patient and understanding verbalized.   Note: Total time spent 25 minutes, greater than 50% of the visit was spent face-to-face counseling and coordinating care for the following: The primary encounter diagnosis was Strain of rhomboid muscle, initial encounter. A diagnosis of Chest discomfort was also pertinent to this visit.Marland Kitchen

## 2016-10-10 NOTE — Addendum Note (Signed)
Addended by: Pixie Casino on: 10/10/2016 08:21 AM   Modules accepted: Orders

## 2016-10-10 NOTE — Patient Instructions (Signed)
I think this pain is most likely explained by muscle spasm rather than something more serious like a heart problem.  However, if pain gets worse or changes, please seek emergency medical care.  Can take the naproxen as directed on the prescription bottle, if pain persists, can add Tylenol/acetaminophen 1000 mg 4 times per day, can use these along with the Voltaren/diclofenac gel prescribed today.

## 2016-10-29 ENCOUNTER — Other Ambulatory Visit: Payer: Self-pay | Admitting: Osteopathic Medicine

## 2016-10-29 DIAGNOSIS — I10 Essential (primary) hypertension: Secondary | ICD-10-CM

## 2016-10-31 ENCOUNTER — Other Ambulatory Visit: Payer: Self-pay | Admitting: Osteopathic Medicine

## 2016-10-31 DIAGNOSIS — I10 Essential (primary) hypertension: Secondary | ICD-10-CM

## 2016-11-27 ENCOUNTER — Other Ambulatory Visit: Payer: Self-pay | Admitting: Osteopathic Medicine

## 2016-11-27 DIAGNOSIS — I10 Essential (primary) hypertension: Secondary | ICD-10-CM

## 2016-12-24 ENCOUNTER — Other Ambulatory Visit: Payer: Self-pay | Admitting: *Deleted

## 2016-12-24 DIAGNOSIS — I10 Essential (primary) hypertension: Secondary | ICD-10-CM

## 2016-12-24 MED ORDER — ATORVASTATIN CALCIUM 80 MG PO TABS: 80.0000 mg | ORAL_TABLET | Freq: Every day | ORAL | 0 refills | Status: DC

## 2016-12-30 ENCOUNTER — Other Ambulatory Visit: Payer: Self-pay | Admitting: Osteopathic Medicine

## 2016-12-30 DIAGNOSIS — I10 Essential (primary) hypertension: Secondary | ICD-10-CM

## 2017-01-28 ENCOUNTER — Other Ambulatory Visit: Payer: Self-pay | Admitting: Osteopathic Medicine

## 2017-01-28 DIAGNOSIS — I10 Essential (primary) hypertension: Secondary | ICD-10-CM

## 2017-02-03 ENCOUNTER — Ambulatory Visit (INDEPENDENT_AMBULATORY_CARE_PROVIDER_SITE_OTHER): Payer: BLUE CROSS/BLUE SHIELD | Admitting: Osteopathic Medicine

## 2017-02-03 ENCOUNTER — Encounter: Payer: Self-pay | Admitting: Osteopathic Medicine

## 2017-02-03 VITALS — BP 130/80 | HR 72 | Wt 131.0 lb

## 2017-02-03 DIAGNOSIS — E785 Hyperlipidemia, unspecified: Secondary | ICD-10-CM

## 2017-02-03 DIAGNOSIS — E559 Vitamin D deficiency, unspecified: Secondary | ICD-10-CM | POA: Diagnosis not present

## 2017-02-03 DIAGNOSIS — E876 Hypokalemia: Secondary | ICD-10-CM | POA: Diagnosis not present

## 2017-02-03 DIAGNOSIS — M810 Age-related osteoporosis without current pathological fracture: Secondary | ICD-10-CM | POA: Diagnosis not present

## 2017-02-03 DIAGNOSIS — I10 Essential (primary) hypertension: Secondary | ICD-10-CM

## 2017-02-03 MED ORDER — ATORVASTATIN CALCIUM 80 MG PO TABS: 80.0000 mg | ORAL_TABLET | Freq: Every day | ORAL | 3 refills | Status: DC

## 2017-02-03 MED ORDER — LOSARTAN POTASSIUM-HCTZ 50-12.5 MG PO TABS: 1.0000 | ORAL_TABLET | Freq: Every day | ORAL | 3 refills | Status: DC

## 2017-02-03 NOTE — Progress Notes (Signed)
HPI: Katrina Baird is a 67 y.o. female who  has a past medical history of Hyperlipidemia with target LDL less than 130 (09/23/2012), Hypertension, Osteoporosis (02/03/2015), and Vertigo.  she presents to [facility] today, 02/03/17,  for chief complaint of:  Follow-up for medication refills    HTN: Took her medications as usual. Needs refills. No chest pain/SOB, no HA/VC.   HLD:  01/2015 last cholesterol panel, will go ahead and order this.   Hypokalemia:  Taking K supplements as below, will recheck with labs.     Patient is accompanied by son who assists with history-taking.   Past medical, surgical, social and family history reviewed:  Patient Active Problem List   Diagnosis Date Noted  . Osteoporosis 02/03/2015  . Axillary lump 01/31/2015  . Colon cancer screening 03/30/2014  . Visit for screening mammogram 03/08/2013  . Insomnia 03/08/2013  . Routine general medical examination at a health care facility 03/08/2013  . Thoracic back pain 09/23/2012  . Essential hypertension, benign 09/23/2012  . Osteoporosis, unspecified 09/23/2012  . Hyperlipidemia with target LDL less than 130 09/23/2012    No past surgical history on file.  Social History   Tobacco Use  . Smoking status: Never Smoker  . Smokeless tobacco: Never Used  Substance Use Topics  . Alcohol use: No    Family History  Problem Relation Age of Onset  . Cancer Neg Hx   . Diabetes Neg Hx   . Early death Neg Hx   . Heart disease Neg Hx   . Hyperlipidemia Neg Hx   . Hypertension Neg Hx   . Kidney disease Neg Hx   . Stroke Neg Hx      Current medication list and allergy/intolerance information reviewed:    Current Outpatient Medications  Medication Sig Dispense Refill  . alendronate (FOSAMAX) 10 MG tablet TAKE 1 TABLET (10 MG TOTAL) BY MOUTH DAILY BEFORE BREAKFAST. 90 tablet 3  . atorvastatin (LIPITOR) 80 MG tablet Take 1 tablet (80 mg total) by mouth daily. Needs labs  15 tablet 0  . diclofenac sodium (VOLTAREN) 1 % GEL Apply 2-4 g topically 4 (four) times daily. To affected joint/muscle as needed for pain 100 g 11  . losartan-hydrochlorothiazide (HYZAAR) 50-12.5 MG tablet Take 1 tablet by mouth daily. 90 tablet 3  . potassium chloride (K-DUR,KLOR-CON) 10 MEQ tablet Take 1 tablet (10 mEq total) by mouth 2 (two) times daily. 180 tablet 3   No current facility-administered medications for this visit.     No Known Allergies    Review of Systems:  Constitutional:  No  fever, no chills, No recent illness  HEENT: No  headache, no vision change,  Cardiac: No  chest pain, No  pressure, No palpitations  Respiratory:  No  shortness of breath.   Gastrointestinal: No  abdominal pain, No  nausea  Exam:  BP 130/80   Pulse 72   Wt 131 lb 0.6 oz (59.4 kg)   BMI 21.81 kg/m   Constitutional: VS see above. General Appearance: alert, well-developed, well-nourished, NAD  Eyes: Normal lids and conjunctive, non-icteric sclera  Ears, Nose, Mouth, Throat: MMM, Normal external inspection ears/nares/mouth/lips/gums.  Neck: No masses, trachea midline.   Respiratory: Normal respiratory effort. no wheeze, no rhonchi, no rales  Cardiovascular: S1/S2 normal, no murmur, no rub/gallop auscultated. RRR. No lower extremity edema.  Musculoskeletal: Gait normal.   Neurological: Normal balance/coordination. No tremor.  Psychiatric: Normal judgment/insight. Normal mood and affect. Oriented x3.  ASSESSMENT/PLAN:   Essential hypertension, benign - Plan: losartan-hydrochlorothiazide (HYZAAR) 50-12.5 MG tablet, CBC, COMPLETE METABOLIC PANEL WITH GFR, Lipid panel  Hyperlipidemia with target LDL less than 130 - Plan: atorvastatin (LIPITOR) 80 MG tablet, Lipid panel  Osteoporosis without current pathological fracture, unspecified osteoporosis type - Plan: VITAMIN D 25 Hydroxy (Vit-D Deficiency, Fractures)  Hypokalemia - Plan: COMPLETE METABOLIC PANEL WITH  GFR  Vitamin D deficiency - Plan: VITAMIN D 25 Hydroxy (Vit-D Deficiency, Fractures)      Visit summary with medication list and pertinent instructions was printed for patient to review. All questions at time of visit were answered - patient instructed to contact office with any additional concerns. ER/RTC precautions were reviewed with the patient.   Follow-up plan: Return in about 6 months (around 08/03/2017) [facility] and recheck blood pressure, sooner if needed .   Please note: voice recognition software was used to produce this document, and typos may escape review. Please contact Dr. Lyn HollingsheadAlexander for any needed clarifications.

## 2017-02-05 LAB — CBC
HEMATOCRIT: 40.8 % (ref 35.0–45.0)
HEMOGLOBIN: 13.8 g/dL (ref 11.7–15.5)
MCH: 26.1 pg — AB (ref 27.0–33.0)
MCHC: 33.8 g/dL (ref 32.0–36.0)
MCV: 77.1 fL — ABNORMAL LOW (ref 80.0–100.0)
MPV: 13.4 fL — ABNORMAL HIGH (ref 7.5–12.5)
Platelets: 237 10*3/uL (ref 140–400)
RBC: 5.29 10*6/uL — ABNORMAL HIGH (ref 3.80–5.10)
RDW: 14.1 % (ref 11.0–15.0)
WBC: 5.1 10*3/uL (ref 3.8–10.8)

## 2017-02-05 LAB — LIPID PANEL
Cholesterol: 185 mg/dL (ref <200)
HDL: 57 mg/dL (ref >50)
LDL CHOLESTEROL (CALC): 109 mg/dL — AB
Non-HDL Cholesterol (Calc): 128 mg/dL (calc) (ref <130)
Total CHOL/HDL Ratio: 3.2 (calc) (ref <5.0)
Triglycerides: 98 mg/dL (ref <150)

## 2017-02-05 LAB — COMPLETE METABOLIC PANEL WITH GFR
AG Ratio: 1.3 (calc) (ref 1.0–2.5)
ALBUMIN MSPROF: 4.3 g/dL (ref 3.6–5.1)
ALKALINE PHOSPHATASE (APISO): 34 U/L (ref 33–130)
ALT: 21 U/L (ref 6–29)
AST: 22 U/L (ref 10–35)
BILIRUBIN TOTAL: 1.2 mg/dL (ref 0.2–1.2)
BUN: 13 mg/dL (ref 7–25)
CO2: 32 mmol/L (ref 20–32)
Calcium: 9.8 mg/dL (ref 8.6–10.4)
Chloride: 100 mmol/L (ref 98–110)
Creat: 0.93 mg/dL (ref 0.50–0.99)
GFR, Est African American: 73 mL/min/{1.73_m2} (ref >=60)
GFR, Est Non African American: 63 mL/min/{1.73_m2} (ref >=60)
GLOBULIN: 3.3 g/dL (ref 1.9–3.7)
Glucose, Bld: 104 mg/dL — ABNORMAL HIGH (ref 65–99)
POTASSIUM: 4 mmol/L (ref 3.5–5.3)
Sodium: 141 mmol/L (ref 135–146)
Total Protein: 7.6 g/dL (ref 6.1–8.1)

## 2017-02-05 LAB — VITAMIN D 25 HYDROXY (VIT D DEFICIENCY, FRACTURES): Vit D, 25-Hydroxy: 35 ng/mL (ref 30–100)

## 2017-02-05 LAB — HEMOGLOBIN A1C W/OUT EAG: HEMOGLOBIN A1C: 5.7 %{Hb} — AB (ref <5.7)

## 2017-02-06 ENCOUNTER — Encounter: Payer: Self-pay | Admitting: Osteopathic Medicine

## 2017-06-27 ENCOUNTER — Other Ambulatory Visit: Payer: Self-pay | Admitting: Osteopathic Medicine

## 2017-06-27 DIAGNOSIS — M810 Age-related osteoporosis without current pathological fracture: Secondary | ICD-10-CM

## 2017-07-02 DIAGNOSIS — H527 Unspecified disorder of refraction: Secondary | ICD-10-CM | POA: Diagnosis not present

## 2017-07-02 DIAGNOSIS — H43811 Vitreous degeneration, right eye: Secondary | ICD-10-CM | POA: Diagnosis not present

## 2017-07-02 DIAGNOSIS — H26491 Other secondary cataract, right eye: Secondary | ICD-10-CM | POA: Diagnosis not present

## 2017-07-02 DIAGNOSIS — H18453 Nodular corneal degeneration, bilateral: Secondary | ICD-10-CM | POA: Diagnosis not present

## 2017-09-26 ENCOUNTER — Other Ambulatory Visit: Payer: Self-pay | Admitting: Osteopathic Medicine

## 2017-09-26 DIAGNOSIS — E876 Hypokalemia: Secondary | ICD-10-CM

## 2018-01-21 ENCOUNTER — Other Ambulatory Visit: Payer: Self-pay | Admitting: Osteopathic Medicine

## 2018-01-21 DIAGNOSIS — E785 Hyperlipidemia, unspecified: Secondary | ICD-10-CM

## 2018-02-17 ENCOUNTER — Other Ambulatory Visit: Payer: Self-pay | Admitting: Osteopathic Medicine

## 2018-02-17 DIAGNOSIS — I10 Essential (primary) hypertension: Secondary | ICD-10-CM

## 2018-02-17 DIAGNOSIS — E785 Hyperlipidemia, unspecified: Secondary | ICD-10-CM

## 2018-02-17 NOTE — Telephone Encounter (Signed)
Requested medication (s) are due for refill today: yes  Requested medication (s) are on the active medication list: yes  Last refill:  01/22/18 #30 no RF  Future visit scheduled: no  Notes to clinic:  Pt is overdue for f/u w/Provider & labs. No refills.   Requested Prescriptions  Pending Prescriptions Disp Refills   atorvastatin (LIPITOR) 80 MG tablet [Pharmacy Med Name: ATORVASTATIN 80 MG TABLET] 30 tablet 0    Sig: TAKE 1 TABLET (80 MG TOTAL) BY MOUTH DAILY. NEEDS LABS     Cardiovascular:  Antilipid - Statins Failed - 02/17/2018  1:37 PM      Failed - Total Cholesterol in normal range and within 360 days    Cholesterol  Date Value Ref Range Status  02/03/2017 185 <200 mg/dL Final         Failed - LDL in normal range and within 360 days    LDL Cholesterol (Calc)  Date Value Ref Range Status  02/03/2017 109 (H) mg/dL (calc) Final    Comment:    Reference range: <100 . Desirable range <100 mg/dL for primary prevention;   <70 mg/dL for patients with CHD or diabetic patients  with > or = 2 CHD risk factors. Marland Kitchen. LDL-C is now calculated using the Martin-Hopkins  calculation, which is a validated novel method providing  better accuracy than the Friedewald equation in the  estimation of LDL-C.  Horald PollenMartin SS et al. Lenox AhrJAMA. 1610;960(452013;310(19): 2061-2068  (http://education.[facility].com/faq/FAQ164)          Failed - HDL in normal range and within 360 days    HDL  Date Value Ref Range Status  02/03/2017 57 >50 mg/dL Final         Failed - Triglycerides in normal range and within 360 days    Triglycerides  Date Value Ref Range Status  02/03/2017 98 <150 mg/dL Final         Failed - Valid encounter within last 12 months    Recent Outpatient Visits          1 year ago Essential hypertension, benign   [facility], Dorene GrebeNatalie, DO   1 year ago Strain of rhomboid muscle, initial encounter   [facility]  Alexander, Dorene GrebeNatalie, DO   1 year ago Essential hypertension   [facility] Alexander, Dorene GrebeNatalie, DO   2 years ago Annual physical exam   [facility], Dorene GrebeNatalie, DO   2 years ago Encounter for immunization   [facility] Alexander, Dorene GrebeNatalie, DO             Passed - Patient is not pregnant

## 2018-02-19 ENCOUNTER — Telehealth: Payer: Self-pay | Admitting: Osteopathic Medicine

## 2018-02-19 MED ORDER — LOSARTAN POTASSIUM 50 MG PO TABS: 50.0000 mg | ORAL_TABLET | Freq: Every day | ORAL | 0 refills | Status: DC

## 2018-02-19 MED ORDER — HYDROCHLOROTHIAZIDE 12.5 MG PO TABS
12.5000 mg | ORAL_TABLET | Freq: Every day | ORAL | 0 refills | Status: DC
Start: 2018-02-19 — End: 2018-03-04

## 2018-02-19 NOTE — Telephone Encounter (Signed)
Sent 30 days BP meds, but pt hasn't been seen >1 year needs follow-up for annual

## 2018-02-20 NOTE — Telephone Encounter (Signed)
Called Pt using interpreter line, ID # [id number], and spoke with Pt's son. Advised of recommendation and annual exam scheduled.   Routing to scheduled and clerical team lead to make sure interpreter is scheduled to come the day of visit. Did make this a 40 min Pt to offset the use of interpreter. Appt scheduled for 03/04/18 at 9:10am.

## 2018-02-24 NOTE — Telephone Encounter (Signed)
I confirmed in Epic that patient is marked as needing interpreter for his language and they usually send someone IF they have someone and if not, we have to use the interpreter on wheels when they do not have anyone. He is scheduled for a 40 minute visit

## 2018-03-04 ENCOUNTER — Encounter: Payer: Self-pay | Admitting: Osteopathic Medicine

## 2018-03-04 ENCOUNTER — Ambulatory Visit (INDEPENDENT_AMBULATORY_CARE_PROVIDER_SITE_OTHER): Payer: Medicaid Other | Admitting: Osteopathic Medicine

## 2018-03-04 VITALS — BP 144/84 | HR 86 | Temp 97.9°F | Wt 130.2 lb

## 2018-03-04 DIAGNOSIS — E785 Hyperlipidemia, unspecified: Secondary | ICD-10-CM | POA: Diagnosis not present

## 2018-03-04 DIAGNOSIS — Z0000 Encounter for general adult medical examination without abnormal findings: Secondary | ICD-10-CM

## 2018-03-04 DIAGNOSIS — Z124 Encounter for screening for malignant neoplasm of cervix: Secondary | ICD-10-CM

## 2018-03-04 DIAGNOSIS — Z1239 Encounter for other screening for malignant neoplasm of breast: Secondary | ICD-10-CM | POA: Diagnosis not present

## 2018-03-04 DIAGNOSIS — M810 Age-related osteoporosis without current pathological fracture: Secondary | ICD-10-CM

## 2018-03-04 DIAGNOSIS — Z1211 Encounter for screening for malignant neoplasm of colon: Secondary | ICD-10-CM

## 2018-03-04 DIAGNOSIS — I10 Essential (primary) hypertension: Secondary | ICD-10-CM | POA: Diagnosis not present

## 2018-03-04 MED ORDER — LOSARTAN POTASSIUM 50 MG PO TABS: 50.0000 mg | ORAL_TABLET | Freq: Every day | ORAL | 3 refills | Status: DC

## 2018-03-04 MED ORDER — ALENDRONATE SODIUM 10 MG PO TABS: 10.0000 mg | ORAL_TABLET | Freq: Every day | ORAL | 3 refills | Status: DC

## 2018-03-04 MED ORDER — ATORVASTATIN CALCIUM 80 MG PO TABS: 80.0000 mg | ORAL_TABLET | Freq: Every day | ORAL | 3 refills | Status: DC

## 2018-03-04 MED ORDER — ZOSTER VAC RECOMB ADJUVANTED 50 MCG/0.5ML IM SUSR: 0.5000 mL | Freq: Once | INTRAMUSCULAR | 1 refills | Status: AC

## 2018-03-04 MED ORDER — HYDROCHLOROTHIAZIDE 12.5 MG PO TABS: 12.5000 mg | ORAL_TABLET | Freq: Every day | ORAL | 3 refills | Status: DC

## 2018-03-04 NOTE — Progress Notes (Signed)
HPI: Katrina Baird is a 68 y.o. female who  has a past medical history of Hyperlipidemia with target LDL less than 130 (09/23/2012), Hypertension, Osteoporosis (02/03/2015), and Vertigo.  she presents to [facility] today, 03/05/18,  for chief complaint of: Annual physical  Follow up HTN     Patient here for annual physical / wellness exam.  See preventive care reviewed as below.   Additional concerns today include: Occasional lightheadedness w/ standing up   Patient is accompanied by interpreter who assists with history-taking.    Past medical, surgical, social and family history reviewed:  Patient Active Problem List   Diagnosis Date Noted  . Osteoporosis 02/03/2015  . Axillary lump 01/31/2015  . Colon cancer screening 03/30/2014  . Visit for screening mammogram 03/08/2013  . Insomnia 03/08/2013  . Thoracic back pain 09/23/2012  . Essential hypertension, benign 09/23/2012  . Hyperlipidemia with target LDL less than 130 09/23/2012    No past surgical history on file.  Social History   Tobacco Use  . Smoking status: Never Smoker  . Smokeless tobacco: Never Used  Substance Use Topics  . Alcohol use: No    Family History  Problem Relation Age of Onset  . Cancer Neg Hx   . Diabetes Neg Hx   . Early death Neg Hx   . Heart disease Neg Hx   . Hyperlipidemia Neg Hx   . Hypertension Neg Hx   . Kidney disease Neg Hx   . Stroke Neg Hx      Current medication list and allergy/intolerance information reviewed:    Current Outpatient Medications  Medication Sig Dispense Refill  . atorvastatin (LIPITOR) 80 MG tablet Take 1 tablet (80 mg total) by mouth daily. 90 tablet 3  . diclofenac sodium (VOLTAREN) 1 % GEL Apply 2-4 g topically 4 (four) times daily. To affected joint/muscle as needed for pain 100 g 11  . hydrochlorothiazide (HYDRODIURIL) 12.5 MG tablet Take 1 tablet (12.5 mg total) by mouth daily. 90 tablet 3  . KLOR-CON 10 10 MEQ tablet  TAKE 1 TABLET BY MOUTH TWICE A DAY 180 tablet 3  . losartan (COZAAR) 50 MG tablet Take 1 tablet (50 mg total) by mouth daily. 90 tablet 3  . alendronate (FOSAMAX) 10 MG tablet Take 1 tablet (10 mg total) by mouth daily before breakfast. 90 tablet 3   No current facility-administered medications for this visit.     No Known Allergies    Review of Systems:  Constitutional:  No  fever, no chills, No recent illness  HEENT: No  headache, no vision change  Cardiac: No  chest pain, No  pressure  Respiratory:  No  shortness of breath. No  Cough  Gastrointestinal: No  abdominal pain, No  nausea, No  vomiting,  No  blood in stool, No  diarrhea, No  constipation   Musculoskeletal: No new myalgia/arthralgia  Skin: No  Rash, No other wounds/concerning lesions  Genitourinary: No  incontinence, No  abnormal genital bleeding, No abnormal genital discharge  Hem/Onc: No  easy bruising/bleeding  Neurologic: No  weakness, +lightheaded without dizziness per HPI, No  slurred speech/focal weakness/facial droop  Psychiatric: No  concerns with depression, No  concerns with anxiety, No sleep problems, No mood problems   Exam:  BP (!) 144/84 (BP Location: Left Arm, Patient Position: Sitting, Cuff Size: Normal)   Pulse 86   Temp 97.9 F (36.6 C) (Oral)   Wt 130 lb 3.2 oz (59.1 kg)  BMI 21.67 kg/m   Constitutional: VS see above. General Appearance: alert, well-developed, well-nourished, NAD  Eyes: Normal lids and conjunctive, non-icteric sclera  Ears, Nose, Mouth, Throat: MMM, Normal external inspection ears/nares/mouth/lips/gums. TM normal bilaterally. Pharynx/tonsils no erythema, no exudate. Nasal mucosa normal.   Neck: No masses, trachea midline. No thyroid enlargement. No tenderness/mass appreciated. No lymphadenopathy  Respiratory: Normal respiratory effort. no wheeze, no rhonchi, no rales  Cardiovascular: S1/S2 normal, no murmur, no rub/gallop auscultated. RRR. No lower extremity  edema.   Gastrointestinal: Nontender, no masses. No hepatomegaly, no splenomegaly.   Musculoskeletal: Gait normal. No clubbing/cyanosis of digits.   Neurological: Normal balance/coordination. No tremor. No cranial nerve deficit on limited exam. Motor and sensation intact and symmetric. Cerebellar reflexes intact.   Skin: warm, dry, intact. No rash/ulcer.   Psychiatric: Normal judgment/insight. Normal mood and affect. Oriented x3.        ASSESSMENT/PLAN: The primary encounter diagnosis was Annual physical exam. Diagnoses of Essential hypertension, benign, Osteoporosis without current pathological fracture, unspecified osteoporosis type, Hyperlipidemia with target LDL less than 130, Breast cancer screening, Colon cancer screening, Cervical cancer screening, and Osteoporosis were also pertinent to this visit.   Orders Placed This Encounter  Procedures  . MM 3D SCREEN BREAST BILATERAL  . DG Bone Density  . CBC  . COMPLETE METABOLIC PANEL WITH GFR  . Lipid panel  . VITAMIN D 25 Hydroxy (Vit-D Deficiency, Fractures)  . Hemoglobin A1c  . Ambulatory referral to Gastroenterology    Meds ordered this encounter  Medications  . alendronate (FOSAMAX) 10 MG tablet    Sig: Take 1 tablet (10 mg total) by mouth daily before breakfast.    Dispense:  90 tablet    Refill:  3  . atorvastatin (LIPITOR) 80 MG tablet    Sig: Take 1 tablet (80 mg total) by mouth daily.    Dispense:  90 tablet    Refill:  3  . hydrochlorothiazide (HYDRODIURIL) 12.5 MG tablet    Sig: Take 1 tablet (12.5 mg total) by mouth daily.    Dispense:  90 tablet    Refill:  3  . losartan (COZAAR) 50 MG tablet    Sig: Take 1 tablet (50 mg total) by mouth daily.    Dispense:  90 tablet    Refill:  3  . Zoster Vaccine Adjuvanted [facility]) injection    Sig: Inject 0.5 mLs into the muscle once for 1 dose. Repeat in 2 to 6 months    Dispense:  0.5 mL    Refill:  1    Patient Instructions  General Preventive  Care  Most recent routine screening lipids/other labs: ordered today.   Everyone should have blood pressure checked once per year.   Tobacco: don't!   Alcohol: don't!  Exercise: as tolerated to reduce risk of cardiovascular disease and diabetes. Strength/resistance training will also prevent osteoporosis.   Mental health: if need for mental health care (medicines, counseling, other), or concerns about moods, please let me know!   Sexual health: if need for STD testing, or if concerns with libido/pain problems, please let me know!  Vaccines  Flu vaccine: recommended for almost everyone, every fall.   Shingles vaccine: Shingrix after age 56. Some people have had the old Zostavax vaccine (you had this in 2015), the new Shingrix is still recommended since it is more effective. See printed prescription.   Pneumonia vaccines: done!  Tetanus booster: Tdap will be due 10/2022 Cancer screenings   Colon cancer screening: recommended for everyone  age 70-75  Breast cancer screening: mammogram recommended annually after age 70.   Cervical cancer screening: Pap smear, will bring you back to the office for this test since you have never had this done.   Lung cancer screening: not needed if never smoked.  Infection screenings . Hepatitis C: already done.  . TB: certain at-risk populations, or depending on work requirements and/or travel history Other  Bone Density Test: ordered.               Visit summary with medication list and pertinent instructions was printed for patient to review. All questions at time of visit were answered - patient instructed to contact office with any additional concerns or updates. ER/RTC precautions were reviewed with the patient.    Please note: voice recognition software was used to produce this document, and typos may escape review. Please contact Dr. Lyn HollingsheadAlexander for any needed clarifications.     Follow-up plan: Return in about 4 weeks  (around 04/01/2018) for Pap smear (woman exam) .

## 2018-03-04 NOTE — Patient Instructions (Addendum)
General Preventive Care  Most recent routine screening lipids/other labs: ordered today.   Everyone should have blood pressure checked once per year.   Tobacco: don't!   Alcohol: don't!  Exercise: as tolerated to reduce risk of cardiovascular disease and diabetes. Strength/resistance training will also prevent osteoporosis.   Mental health: if need for mental health care (medicines, counseling, other), or concerns about moods, please let me know!   Sexual health: if need for STD testing, or if concerns with libido/pain problems, please let me know!  Vaccines  Flu vaccine: recommended for almost everyone, every fall.   Shingles vaccine: Shingrix after age 11. Some people have had the old Zostavax vaccine (you had this in 2015), the new Shingrix is still recommended since it is more effective. See printed prescription.   Pneumonia vaccines: done!  Tetanus booster: Tdap will be due 10/2022 Cancer screenings   Colon cancer screening: recommended for everyone age 7-75  Breast cancer screening: mammogram recommended annually after age 56.   Cervical cancer screening: Pap smear, will bring you back to the office for this test since you have never had this done.   Lung cancer screening: not needed if never smoked.  Infection screenings . Hepatitis C: already done.  . TB: certain at-risk populations, or depending on work requirements and/or travel history Other  Bone Density Test: ordered.

## 2018-03-05 ENCOUNTER — Encounter: Payer: Self-pay | Admitting: Osteopathic Medicine

## 2018-03-05 DIAGNOSIS — Z0000 Encounter for general adult medical examination without abnormal findings: Secondary | ICD-10-CM | POA: Diagnosis not present

## 2018-03-05 DIAGNOSIS — M810 Age-related osteoporosis without current pathological fracture: Secondary | ICD-10-CM | POA: Diagnosis not present

## 2018-03-05 DIAGNOSIS — E785 Hyperlipidemia, unspecified: Secondary | ICD-10-CM | POA: Diagnosis not present

## 2018-03-05 DIAGNOSIS — I10 Essential (primary) hypertension: Secondary | ICD-10-CM | POA: Diagnosis not present

## 2018-03-06 LAB — LIPID PANEL
Cholesterol: 186 mg/dL (ref <200)
HDL: 55 mg/dL (ref >=50)
LDL Cholesterol (Calc): 111 mg/dL (calc) — ABNORMAL HIGH
NON-HDL CHOLESTEROL (CALC): 131 mg/dL — AB (ref <130)
Total CHOL/HDL Ratio: 3.4 (calc) (ref <5.0)
Triglycerides: 97 mg/dL (ref <150)

## 2018-03-06 LAB — COMPLETE METABOLIC PANEL WITH GFR
AG Ratio: 1.4 (calc) (ref 1.0–2.5)
ALT: 19 U/L (ref 6–29)
AST: 21 U/L (ref 10–35)
Albumin: 4.3 g/dL (ref 3.6–5.1)
Alkaline phosphatase (APISO): 33 U/L — ABNORMAL LOW (ref 37–153)
BUN: 18 mg/dL (ref 7–25)
CO2: 31 mmol/L (ref 20–32)
Calcium: 9.4 mg/dL (ref 8.6–10.4)
Chloride: 101 mmol/L (ref 98–110)
Creat: 0.87 mg/dL (ref 0.60–0.93)
GFR, Est African American: 78 mL/min/{1.73_m2} (ref >=60)
GFR, Est Non African American: 67 mL/min/{1.73_m2} (ref >=60)
Globulin: 3 g/dL (calc) (ref 1.9–3.7)
Glucose, Bld: 103 mg/dL — ABNORMAL HIGH (ref 65–99)
POTASSIUM: 3.9 mmol/L (ref 3.5–5.3)
Sodium: 141 mmol/L (ref 135–146)
Total Bilirubin: 0.9 mg/dL (ref 0.2–1.2)
Total Protein: 7.3 g/dL (ref 6.1–8.1)

## 2018-03-06 LAB — HEMOGLOBIN A1C
Hgb A1c MFr Bld: 5.8 % of total Hgb — ABNORMAL HIGH (ref <5.7)
Mean Plasma Glucose: 120 (calc)
eAG (mmol/L): 6.6 (calc)

## 2018-03-06 LAB — CBC
HCT: 41.5 % (ref 35.0–45.0)
HEMOGLOBIN: 13.3 g/dL (ref 11.7–15.5)
MCH: 25.2 pg — AB (ref 27.0–33.0)
MCHC: 32 g/dL (ref 32.0–36.0)
MCV: 78.6 fL — ABNORMAL LOW (ref 80.0–100.0)
MPV: 13 fL — ABNORMAL HIGH (ref 7.5–12.5)
Platelets: 247 10*3/uL (ref 140–400)
RBC: 5.28 10*6/uL — ABNORMAL HIGH (ref 3.80–5.10)
RDW: 14.1 % (ref 11.0–15.0)
WBC: 4.9 10*3/uL (ref 3.8–10.8)

## 2018-03-06 LAB — VITAMIN D 25 HYDROXY (VIT D DEFICIENCY, FRACTURES): Vit D, 25-Hydroxy: 33 ng/mL (ref 30–100)

## 2018-03-11 ENCOUNTER — Ambulatory Visit: Payer: Self-pay

## 2018-03-11 NOTE — Telephone Encounter (Deleted)
Review, update, and close the [employer] ticket. Document the chart correction case number in the SN ticket.

## 2018-03-25 ENCOUNTER — Ambulatory Visit (INDEPENDENT_AMBULATORY_CARE_PROVIDER_SITE_OTHER): Payer: Medicaid Other

## 2018-03-25 DIAGNOSIS — Z1231 Encounter for screening mammogram for malignant neoplasm of breast: Secondary | ICD-10-CM | POA: Diagnosis not present

## 2018-03-25 DIAGNOSIS — M810 Age-related osteoporosis without current pathological fracture: Secondary | ICD-10-CM | POA: Diagnosis not present

## 2018-04-02 ENCOUNTER — Ambulatory Visit: Payer: Medicaid Other | Admitting: Osteopathic Medicine

## 2018-04-08 ENCOUNTER — Encounter: Payer: Self-pay | Admitting: Osteopathic Medicine

## 2018-09-13 ENCOUNTER — Other Ambulatory Visit: Payer: Self-pay | Admitting: Osteopathic Medicine

## 2018-09-13 DIAGNOSIS — E876 Hypokalemia: Secondary | ICD-10-CM

## 2018-10-22 ENCOUNTER — Encounter: Payer: Self-pay | Admitting: Emergency Medicine

## 2018-10-22 ENCOUNTER — Emergency Department
Admission: EM | Admit: 2018-10-22 | Discharge: 2018-10-22 | Disposition: A | Discharge status: Home / Self Care | Payer: Medicaid Other | Source: Home / Self Care

## 2018-10-22 ENCOUNTER — Other Ambulatory Visit: Payer: Self-pay

## 2018-10-22 DIAGNOSIS — R42 Dizziness and giddiness: Secondary | ICD-10-CM

## 2018-10-22 MED ORDER — MECLIZINE HCL 12.5 MG PO TABS: 12.5000 mg | ORAL_TABLET | Freq: Three times a day (TID) | ORAL | 0 refills | Status: DC | PRN

## 2018-10-22 NOTE — Discharge Instructions (Signed)
°  Antivert (meclizine) is a medication to help with dizziness and nausea related to vertigo.  This medication can cause drowsiness. Do not operate heavy machinery or drive while taking.   Please follow up with family medicine early next week if not improving.  Call 911 or have someone drive you to the hospital if symptoms worsening- severe headache, unable to walk, passing out, change in vision, weakness or numbness in arms or legs, confusion, or other new concerning symptoms develop.

## 2018-10-22 NOTE — ED Provider Notes (Signed)
[facility]    CSN: [id number] Arrival date & time: 10/22/18  1203      History   Chief Complaint Chief Complaint  Patient presents with  . Dizziness    HPI Katrina Baird is a 68 y.o. female.   Pt speaks vietnamese, declined Adult nurseprofessional translator, will use pt's daughter as Nurse, learning disabilitytranslator.   HPI Katrina Baird is a 68 y.o. female presenting to UC with adult daughter with c/o intermittent dizziness over the last 4 days.  Dizziness is worse in the morning, making it difficult to get out of bed at times.  Mild nausea but no vomiting.  Dizziness is described as room spinning.  Dizziness improves when staying still and closing her eyes. Dizziness is worse with certain movements. Symptoms have improved significantly today but when daughter called PCP's office to schedule an appointment they recommended pt be evaluated in UC first.   Daughter lives with pt. She notes pt has seemed a little more fatigued but overall has been acting herself, no confusion or slurred speech. No recent change in medications or OTC supplements.    Past Medical History:  Diagnosis Date  . Hyperlipidemia with target LDL less than 130 09/23/2012  . Hypertension   . Osteoporosis 02/03/2015  . Vertigo     Patient Active Problem List   Diagnosis Date Noted  . Osteoporosis 02/03/2015  . Axillary lump 01/31/2015  . Colon cancer screening 03/30/2014  . Visit for screening mammogram 03/08/2013  . Insomnia 03/08/2013  . Thoracic back pain 09/23/2012  . Essential hypertension, benign 09/23/2012  . Hyperlipidemia with target LDL less than 130 09/23/2012    History reviewed. No pertinent surgical history.  OB History   No obstetric history on file.      Home Medications    Prior to Admission medications   Medication Sig Start Date End Date Taking? Authorizing Provider  alendronate (FOSAMAX) 10 MG tablet Take 1 tablet (10 mg total) by mouth daily before breakfast. 03/04/18   Sunnie NielsenAlexander, Natalie, DO   atorvastatin (LIPITOR) 80 MG tablet Take 1 tablet (80 mg total) by mouth daily. 03/04/18   Sunnie NielsenAlexander, Natalie, DO  diclofenac sodium (VOLTAREN) 1 % GEL Apply 2-4 g topically 4 (four) times daily. To affected joint/muscle as needed for pain 10/10/16   Sunnie NielsenAlexander, Natalie, DO  hydrochlorothiazide (HYDRODIURIL) 12.5 MG tablet Take 1 tablet (12.5 mg total) by mouth daily. 03/04/18   Sunnie NielsenAlexander, Natalie, DO  losartan (COZAAR) 50 MG tablet Take 1 tablet (50 mg total) by mouth daily. 03/04/18   Sunnie NielsenAlexander, Natalie, DO  meclizine (ANTIVERT) 12.5 MG tablet Take 1 tablet (12.5 mg total) by mouth 3 (three) times daily as needed for dizziness or nausea. 10/22/18   Lurene ShadowPhelps, Jabri Blancett O, PA-C  potassium chloride (K-DUR) 10 MEQ tablet TAKE 1 TABLET BY MOUTH TWICE A DAY 09/14/18   Sunnie NielsenAlexander, Natalie, DO    Family History Family History  Problem Relation Age of Onset  . Cancer Neg Hx   . Diabetes Neg Hx   . Early death Neg Hx   . Heart disease Neg Hx   . Hyperlipidemia Neg Hx   . Hypertension Neg Hx   . Kidney disease Neg Hx   . Stroke Neg Hx     Social History Social History   Tobacco Use  . Smoking status: Never Smoker  . Smokeless tobacco: Never Used  Substance Use Topics  . Alcohol use: No  . Drug use: No     Allergies   Patient has no  known allergies.   Review of Systems Review of Systems  Constitutional: Negative for chills and fever.  HENT: Negative for congestion, ear pain, sore throat, trouble swallowing and voice change.   Respiratory: Negative for cough and shortness of breath.   Cardiovascular: Negative for chest pain and palpitations.  Gastrointestinal: Positive for nausea. Negative for abdominal pain, diarrhea and vomiting.  Musculoskeletal: Negative for arthralgias, back pain and myalgias.  Skin: Negative for rash.  Neurological: Positive for dizziness. Negative for tremors, seizures, syncope, facial asymmetry, speech difficulty, weakness, light-headedness, numbness and headaches.      Physical Exam Triage Vital Signs ED Triage Vitals  Enc Vitals Group     BP 10/22/18 1229 123/86     Pulse Rate 10/22/18 1229 92     Resp --      Temp 10/22/18 1229 98.7 F (37.1 C)     Temp Source 10/22/18 1229 Oral     SpO2 10/22/18 1229 99 %     Weight --      Height --      Head Circumference --      Peak Flow --      Pain Score 10/22/18 1227 0     Pain Loc --      Pain Edu? --      Excl. in GC? --    Orthostatic VS for the past 24 hrs:  BP- Lying Pulse- Lying BP- Sitting Pulse- Sitting BP- Standing at 0 minutes Pulse- Standing at 0 minutes  10/22/18 1231 124/85 88 129/90 98 (!) 123/91 110    Updated Vital Signs BP 123/86 (BP Location: Right Arm)   Pulse 92   Temp 98.7 F (37.1 C) (Oral)   SpO2 99%      Physical Exam Vitals signs and nursing note reviewed.  Constitutional:      Appearance: Normal appearance. She is well-developed.  HENT:     Head: Normocephalic and atraumatic.     Right Ear: Tympanic membrane and ear canal normal.     Left Ear: Tympanic membrane and ear canal normal.     Nose: Nose normal.     Right Sinus: No maxillary sinus tenderness or frontal sinus tenderness.     Left Sinus: No maxillary sinus tenderness or frontal sinus tenderness.     Mouth/Throat:     Lips: Pink.     Mouth: Mucous membranes are moist.     Pharynx: Oropharynx is clear. Uvula midline.  Eyes:     Extraocular Movements: Extraocular movements intact.     Conjunctiva/sclera: Conjunctivae normal.     Pupils: Pupils are equal, round, and reactive to light.  Neck:     Musculoskeletal: Normal range of motion and neck supple.  Cardiovascular:     Rate and Rhythm: Normal rate and regular rhythm.  Pulmonary:     Effort: Pulmonary effort is normal. No respiratory distress.     Breath sounds: Normal breath sounds.  Musculoskeletal: Normal range of motion.  Skin:    General: Skin is warm and dry.     Capillary Refill: Capillary refill takes less than 2 seconds.   Neurological:     General: No focal deficit present.     Mental Status: She is alert and oriented to person, place, and time.     Cranial Nerves: No cranial nerve deficit.     Sensory: No sensory deficit.     Motor: No weakness.     Coordination: Coordination normal.     Gait: Gait normal.  Psychiatric:        Mood and Affect: Mood normal.        Behavior: Behavior normal.      UC Treatments / Results  Labs (all labs ordered are listed, but only abnormal results are displayed) Labs Reviewed - No data to display  EKG   Radiology No results found.  Procedures Procedures (including critical care time)  Medications Ordered in UC Medications - No data to display  Initial Impression / Assessment and Plan / UC Course  I have reviewed the triage vital signs and the nursing notes.  Pertinent labs & imaging results that were available during my care of the patient were reviewed by me and considered in my medical decision making (see chart for details).     Normal exam including unremarkable orthostatic vitals Pt denies dizziness at this time. Hx most c/w vertigo. Will prescribe small amount of meclizine for pt to try if dizziness returns encouraged f/u with PCP next week fi needed Discussed symptoms that warrant emergent care in the ED. AVS provided  Final Clinical Impressions(s) / UC Diagnoses   Final diagnoses:  Dizziness     Discharge Instructions      Antivert (meclizine) is a medication to help with dizziness and nausea related to vertigo.  This medication can cause drowsiness. Do not operate heavy machinery or drive while taking.   Please follow up with family medicine early next week if not improving.  Call 911 or have someone drive you to the hospital if symptoms worsening- severe headache, unable to walk, passing out, change in vision, weakness or numbness in arms or legs, confusion, or other new concerning symptoms develop.    ED Prescriptions     Medication Sig Dispense Auth. Provider   meclizine (ANTIVERT) 12.5 MG tablet Take 1 tablet (12.5 mg total) by mouth 3 (three) times daily as needed for dizziness or nausea. 12 tablet Noe Gens, [place]     PDMP not reviewed this encounter.   Noe Gens, [place] 10/22/18 1352

## 2018-10-22 NOTE — ED Triage Notes (Signed)
Pt does not speak english. She is here with her granddaughter who translates. States she has been dizzy since Sunday. Can not get out of bed sometimes. Feels like the room is spinning. She is ok when she closes her eyes and does not move but is she moves around she is dizzy and nauseas.

## 2019-01-09 ENCOUNTER — Other Ambulatory Visit: Payer: Self-pay | Admitting: Osteopathic Medicine

## 2019-01-09 DIAGNOSIS — E876 Hypokalemia: Secondary | ICD-10-CM

## 2019-01-09 NOTE — Telephone Encounter (Signed)
Forwarding medication refill request to the clinical pool for review. 

## 2019-02-24 ENCOUNTER — Other Ambulatory Visit: Payer: Self-pay | Admitting: Osteopathic Medicine

## 2019-02-24 DIAGNOSIS — M810 Age-related osteoporosis without current pathological fracture: Secondary | ICD-10-CM

## 2019-02-24 NOTE — Telephone Encounter (Signed)
Please review for refill- patient at [facility] 

## 2019-03-15 DIAGNOSIS — Z23 Encounter for immunization: Secondary | ICD-10-CM | POA: Diagnosis not present

## 2019-03-18 ENCOUNTER — Encounter: Payer: Self-pay | Admitting: Osteopathic Medicine

## 2019-03-18 ENCOUNTER — Ambulatory Visit (INDEPENDENT_AMBULATORY_CARE_PROVIDER_SITE_OTHER): Payer: Medicaid Other | Admitting: Osteopathic Medicine

## 2019-03-18 ENCOUNTER — Other Ambulatory Visit: Payer: Self-pay

## 2019-03-18 VITALS — BP 138/77 | HR 80 | Temp 98.1°F | Wt 151.0 lb

## 2019-03-18 DIAGNOSIS — E785 Hyperlipidemia, unspecified: Secondary | ICD-10-CM | POA: Diagnosis not present

## 2019-03-18 DIAGNOSIS — I10 Essential (primary) hypertension: Secondary | ICD-10-CM

## 2019-03-18 DIAGNOSIS — Z0000 Encounter for general adult medical examination without abnormal findings: Secondary | ICD-10-CM

## 2019-03-18 DIAGNOSIS — Z1231 Encounter for screening mammogram for malignant neoplasm of breast: Secondary | ICD-10-CM

## 2019-03-18 DIAGNOSIS — Z1211 Encounter for screening for malignant neoplasm of colon: Secondary | ICD-10-CM

## 2019-03-18 DIAGNOSIS — M810 Age-related osteoporosis without current pathological fracture: Secondary | ICD-10-CM | POA: Diagnosis not present

## 2019-03-18 DIAGNOSIS — R7302 Impaired glucose tolerance (oral): Secondary | ICD-10-CM

## 2019-03-18 DIAGNOSIS — E876 Hypokalemia: Secondary | ICD-10-CM | POA: Diagnosis not present

## 2019-03-18 MED ORDER — DICLOFENAC SODIUM 1 % EX GEL: 4.0000 g | Freq: Four times a day (QID) | CUTANEOUS | 11 refills | Status: DC

## 2019-03-18 MED ORDER — POTASSIUM CHLORIDE ER 10 MEQ PO TBCR: 20.0000 meq | EXTENDED_RELEASE_TABLET | Freq: Every day | ORAL | 3 refills | Status: DC

## 2019-03-18 MED ORDER — LOSARTAN POTASSIUM 50 MG PO TABS: 50.0000 mg | ORAL_TABLET | Freq: Every day | ORAL | 3 refills | Status: DC

## 2019-03-18 MED ORDER — MECLIZINE HCL 12.5 MG PO TABS: 12.5000 mg | ORAL_TABLET | Freq: Three times a day (TID) | ORAL | 1 refills | Status: DC | PRN

## 2019-03-18 MED ORDER — HYDROCHLOROTHIAZIDE 12.5 MG PO TABS: 12.5000 mg | ORAL_TABLET | Freq: Every day | ORAL | 3 refills | Status: DC

## 2019-03-18 MED ORDER — ALENDRONATE SODIUM 10 MG PO TABS: 10.0000 mg | ORAL_TABLET | Freq: Every day | ORAL | 3 refills | Status: DC

## 2019-03-18 MED ORDER — ATORVASTATIN CALCIUM 80 MG PO TABS: 80.0000 mg | ORAL_TABLET | Freq: Every day | ORAL | 3 refills | Status: DC

## 2019-03-18 NOTE — Progress Notes (Signed)
HPI: Katrina Baird is a 69 y.o. female who  has a past medical history of Hyperlipidemia with target LDL less than 130 (09/23/2012), Hypertension, Osteoporosis (02/03/2015), and Vertigo.  she presents to [facility] today, 03/18/19,  for chief complaint of: Annual physical   Occasional high BP at home Car sickness, needs refill on Meclizine   Past medical, surgical, social and family history reviewed:  Patient Active Problem List   Diagnosis Date Noted  . Osteoporosis 02/03/2015  . Axillary lump 01/31/2015  . Colon cancer screening 03/30/2014  . Visit for screening mammogram 03/08/2013  . Insomnia 03/08/2013  . Thoracic back pain 09/23/2012  . Essential hypertension, benign 09/23/2012  . Hyperlipidemia with target LDL less than 130 09/23/2012    History reviewed. No pertinent surgical history.  Social History   Tobacco Use  . Smoking status: Never Smoker  . Smokeless tobacco: Never Used  Substance Use Topics  . Alcohol use: No    Family History  Problem Relation Age of Onset  . Cancer Neg Hx   . Diabetes Neg Hx   . Early death Neg Hx   . Heart disease Neg Hx   . Hyperlipidemia Neg Hx   . Hypertension Neg Hx   . Kidney disease Neg Hx   . Stroke Neg Hx      Current medication list and allergy/intolerance information reviewed:    Current Outpatient Medications  Medication Sig Dispense Refill  . alendronate (FOSAMAX) 10 MG tablet Take 1 tablet (10 mg total) by mouth daily before breakfast. 90 tablet 3  . atorvastatin (LIPITOR) 80 MG tablet Take 1 tablet (80 mg total) by mouth daily. 90 tablet 3  . diclofenac Sodium (VOLTAREN) 1 % GEL Apply 4 g topically 4 (four) times daily. To affected joint. 350 g 11  . hydrochlorothiazide (HYDRODIURIL) 12.5 MG tablet Take 1 tablet (12.5 mg total) by mouth daily. 90 tablet 3  . losartan (COZAAR) 50 MG tablet Take 1 tablet (50 mg total) by mouth daily. 90 tablet 3  . meclizine (ANTIVERT) 12.5 MG tablet  Take 1 tablet (12.5 mg total) by mouth 3 (three) times daily as needed for dizziness or nausea. 24 tablet 1  . potassium chloride (KLOR-CON) 10 MEQ tablet Take 2 tablets (20 mEq total) by mouth daily. 180 tablet 3   No current facility-administered medications for this visit.    No Known Allergies    Review of Systems:  Constitutional:  No  fever, no chills, No recent illness,  HEENT: No  headache  Cardiac: No  chest pain  Respiratory:  No  shortness of breath. No  Cough  Gastrointestinal: No  abdominal pain  Musculoskeletal: No new myalgia/arthralgia  Neurologic: No  weakness, +dizziness if BP high, better when BP rechecked and lower  Psychiatric: No  concerns with depression, No  concerns with anxiety   Exam:  BP 138/77 (BP Location: Right Arm, Patient Position: Sitting, Cuff Size: Normal)   Pulse 80   Temp 98.1 F (36.7 C) (Oral)   Wt 151 lb 0.6 oz (68.5 kg)   BMI 25.13 kg/m   Constitutional: VS see above. General Appearance: alert, well-developed, well-nourished, NAD  Eyes: Normal lids and conjunctive, non-icteric sclera  Ears, Nose, Mouth, Throat: TM normal bilaterally.   Neck: No masses, trachea midline. No thyroid enlargement. No tenderness/mass appreciated. No lymphadenopathy  Respiratory: Normal respiratory effort. no wheeze, no rhonchi, no rales  Cardiovascular: S1/S2 normal, no murmur, no rub/gallop auscultated. RRR. No lower extremity  edema.  Gastrointestinal: Nontender, no masses. No hepatomegaly, no splenomegaly. No hernia appreciated. Bowel sounds normal. Rectal exam deferred.   Musculoskeletal: Gait normal. No clubbing/cyanosis of digits.   Neurological: Normal balance/coordination. No tremor. No cranial nerve deficit on limited exam. Motor and sensation intact and symmetric. Cerebellar reflexes intact.   Skin: warm, dry, intact. No rash/ulcer.   Psychiatric: Normal judgment/insight. Normal mood and affect. Oriented x3.    No results found  for this or any previous visit (from the past 72 hour(s)).  No results found.   ASSESSMENT/PLAN: The primary encounter diagnosis was Annual physical exam. Diagnoses of Osteoporosis without current pathological fracture, unspecified osteoporosis type, Colon cancer screening, Essential hypertension, benign, Encounter for screening mammogram for malignant neoplasm of breast, Glucose intolerance (impaired glucose tolerance), Hyperlipidemia with target LDL less than 130, and Hypokalemia were also pertinent to this visit.   Orders Placed This Encounter  Procedures  . MM 3D SCREEN BREAST BILATERAL  . CBC  . COMPLETE METABOLIC PANEL WITH GFR  . Lipid panel  . Hemoglobin A1c  . VITAMIN D 25 Hydroxy (Vit-D Deficiency, Fractures)  . Ambulatory referral to Gastroenterology    Meds ordered this encounter  Medications  . alendronate (FOSAMAX) 10 MG tablet    Sig: Take 1 tablet (10 mg total) by mouth daily before breakfast.    Dispense:  90 tablet    Refill:  3  . atorvastatin (LIPITOR) 80 MG tablet    Sig: Take 1 tablet (80 mg total) by mouth daily.    Dispense:  90 tablet    Refill:  3  . hydrochlorothiazide (HYDRODIURIL) 12.5 MG tablet    Sig: Take 1 tablet (12.5 mg total) by mouth daily.    Dispense:  90 tablet    Refill:  3  . losartan (COZAAR) 50 MG tablet    Sig: Take 1 tablet (50 mg total) by mouth daily.    Dispense:  90 tablet    Refill:  3  . meclizine (ANTIVERT) 12.5 MG tablet    Sig: Take 1 tablet (12.5 mg total) by mouth 3 (three) times daily as needed for dizziness or nausea.    Dispense:  24 tablet    Refill:  1  . potassium chloride (KLOR-CON) 10 MEQ tablet    Sig: Take 2 tablets (20 mEq total) by mouth daily.    Dispense:  180 tablet    Refill:  3  . diclofenac Sodium (VOLTAREN) 1 % GEL    Sig: Apply 4 g topically 4 (four) times daily. To affected joint.    Dispense:  350 g    Refill:  11    Patient Instructions  General Preventive Care  Labs ordered today    Tobacco: don't!   Alcohol: responsible moderation is ok for most adults - if you have concerns about your alcohol intake, please talk to me!   Exercise: as tolerated to reduce risk of cardiovascular disease and diabetes. Strength training will also prevent osteoporosis.   Mental health: if need for mental health care (medicines, counseling, other), or concerns about moods, please let me know!   Sexual health: if need for STD testing, or if concerns with libido/pain problems, please let me know!   Advanced Directive: Living Will and/or Healthcare Power of Attorney recommended for all adults, regardless of age or health.  Vaccines  Flu vaccine: recommended for almost everyone, every fall.   Shingles vaccine: Zostavax done. If you desire the newer Shingrix vaccine, please contact your pharmacy.  Pneumonia vaccines: all done  Tetanus booster: due 2024  COVID vaccine: as directed! Thanks for getting this shot!  Cancer screenings   Colon cancer screening: recommended for everyone at age 88-75  Breast cancer screening: mammogram yearly   Cervical cancer screening: no longer need Paps over age 31  Lung cancer screening: not needed for non-smokers  Infection screenings . HIV: screening done . Gonorrhea/Chlamydia: screening as needed . Hepatitis C: screening done . TB: certain at-risk populations, or depending on work requirements and/or travel history Other . Bone Density Test: will repeat next year. Vitamin D recommend 2000 units daily, plus 1300 mg calcium        Visit summary with medication list and pertinent instructions was printed for patient to review. All questions at time of visit were answered - patient instructed to contact office with any additional concerns or updates. ER/RTC precautions were reviewed with the patient.     Please note: voice recognition software was used to produce this document, and typos may escape review. Please contact Dr. Lyn Hollingshead for  any needed clarifications.     Follow-up plan: Return in about 2 weeks (around 04/01/2019) for nurse visit - BRING HOME BLOOD PRESSURE MACHINE to recheck BP.

## 2019-03-18 NOTE — Patient Instructions (Signed)
General Preventive Care  Labs ordered today   Tobacco: don't!   Alcohol: responsible moderation is ok for most adults - if you have concerns about your alcohol intake, please talk to me!   Exercise: as tolerated to reduce risk of cardiovascular disease and diabetes. Strength training will also prevent osteoporosis.   Mental health: if need for mental health care (medicines, counseling, other), or concerns about moods, please let me know!   Sexual health: if need for STD testing, or if concerns with libido/pain problems, please let me know!   Advanced Directive: Living Will and/or Healthcare Power of Attorney recommended for all adults, regardless of age or health.  Vaccines  Flu vaccine: recommended for almost everyone, every fall.   Shingles vaccine: Zostavax done. If you desire the newer Shingrix vaccine, please contact your pharmacy.   Pneumonia vaccines: all done  Tetanus booster: due 2024  COVID vaccine: as directed! Thanks for getting this shot!  Cancer screenings   Colon cancer screening: recommended for everyone at age 25-75  Breast cancer screening: mammogram yearly   Cervical cancer screening: no longer need Paps over age 79  Lung cancer screening: not needed for non-smokers  Infection screenings . HIV: screening done . Gonorrhea/Chlamydia: screening as needed . Hepatitis C: screening done . TB: certain at-risk populations, or depending on work requirements and/or travel history Other . Bone Density Test: will repeat next year. Vitamin D recommend 2000 units daily, plus 1300 mg calcium

## 2019-03-25 DIAGNOSIS — Z1231 Encounter for screening mammogram for malignant neoplasm of breast: Secondary | ICD-10-CM | POA: Diagnosis not present

## 2019-03-25 DIAGNOSIS — Z1211 Encounter for screening for malignant neoplasm of colon: Secondary | ICD-10-CM | POA: Diagnosis not present

## 2019-03-25 DIAGNOSIS — Z0000 Encounter for general adult medical examination without abnormal findings: Secondary | ICD-10-CM | POA: Diagnosis not present

## 2019-03-25 DIAGNOSIS — I10 Essential (primary) hypertension: Secondary | ICD-10-CM | POA: Diagnosis not present

## 2019-03-25 DIAGNOSIS — R7302 Impaired glucose tolerance (oral): Secondary | ICD-10-CM | POA: Diagnosis not present

## 2019-03-25 DIAGNOSIS — M810 Age-related osteoporosis without current pathological fracture: Secondary | ICD-10-CM | POA: Diagnosis not present

## 2019-03-26 LAB — CBC
HCT: 41.1 % (ref 35.0–45.0)
Hemoglobin: 13.4 g/dL (ref 11.7–15.5)
MCH: 25.6 pg — ABNORMAL LOW (ref 27.0–33.0)
MCHC: 32.6 g/dL (ref 32.0–36.0)
MCV: 78.4 fL — ABNORMAL LOW (ref 80.0–100.0)
MPV: 12.5 fL (ref 7.5–12.5)
Platelets: 273 10*3/uL (ref 140–400)
RBC: 5.24 10*6/uL — ABNORMAL HIGH (ref 3.80–5.10)
RDW: 13.9 % (ref 11.0–15.0)
WBC: 6.5 10*3/uL (ref 3.8–10.8)

## 2019-03-26 LAB — COMPLETE METABOLIC PANEL WITH GFR
AG Ratio: 1.3 (calc) (ref 1.0–2.5)
ALT: 51 U/L — ABNORMAL HIGH (ref 6–29)
AST: 35 U/L (ref 10–35)
Albumin: 4.1 g/dL (ref 3.6–5.1)
Alkaline phosphatase (APISO): 35 U/L — ABNORMAL LOW (ref 37–153)
BUN: 18 mg/dL (ref 7–25)
CO2: 29 mmol/L (ref 20–32)
Calcium: 9.3 mg/dL (ref 8.6–10.4)
Chloride: 102 mmol/L (ref 98–110)
Creat: 0.8 mg/dL (ref 0.60–0.93)
GFR, Est African American: 86 mL/min/{1.73_m2} (ref >=60)
GFR, Est Non African American: 74 mL/min/{1.73_m2} (ref >=60)
Globulin: 3.1 g/dL (calc) (ref 1.9–3.7)
Glucose, Bld: 106 mg/dL — ABNORMAL HIGH (ref 65–99)
Potassium: 4 mmol/L (ref 3.5–5.3)
Sodium: 140 mmol/L (ref 135–146)
Total Bilirubin: 0.8 mg/dL (ref 0.2–1.2)
Total Protein: 7.2 g/dL (ref 6.1–8.1)

## 2019-03-26 LAB — HEMOGLOBIN A1C
Hgb A1c MFr Bld: 6 % of total Hgb — ABNORMAL HIGH (ref <5.7)
Mean Plasma Glucose: 126 (calc)
eAG (mmol/L): 7 (calc)

## 2019-03-26 LAB — LIPID PANEL
Cholesterol: 173 mg/dL (ref <200)
HDL: 55 mg/dL (ref >=50)
LDL Cholesterol (Calc): 98 mg/dL (calc)
Non-HDL Cholesterol (Calc): 118 mg/dL (calc) (ref <130)
Total CHOL/HDL Ratio: 3.1 (calc) (ref <5.0)
Triglycerides: 107 mg/dL (ref <150)

## 2019-03-26 LAB — VITAMIN D 25 HYDROXY (VIT D DEFICIENCY, FRACTURES): Vit D, 25-Hydroxy: 28 ng/mL — ABNORMAL LOW (ref 30–100)

## 2019-04-07 ENCOUNTER — Other Ambulatory Visit: Payer: Self-pay

## 2019-04-07 ENCOUNTER — Ambulatory Visit (INDEPENDENT_AMBULATORY_CARE_PROVIDER_SITE_OTHER): Payer: Medicaid Other

## 2019-04-07 DIAGNOSIS — Z0000 Encounter for general adult medical examination without abnormal findings: Secondary | ICD-10-CM | POA: Diagnosis not present

## 2019-04-07 DIAGNOSIS — Z1231 Encounter for screening mammogram for malignant neoplasm of breast: Secondary | ICD-10-CM

## 2019-04-08 ENCOUNTER — Other Ambulatory Visit: Payer: Self-pay | Admitting: Osteopathic Medicine

## 2019-04-08 DIAGNOSIS — R928 Other abnormal and inconclusive findings on diagnostic imaging of breast: Secondary | ICD-10-CM

## 2019-04-12 DIAGNOSIS — Z23 Encounter for immunization: Secondary | ICD-10-CM | POA: Diagnosis not present

## 2019-05-11 ENCOUNTER — Ambulatory Visit
Admission: RE | Admit: 2019-05-11 | Discharge: 2019-05-11 | Disposition: A | Discharge status: Home / Self Care | Payer: Medicaid Other | Source: Ambulatory Visit | Attending: Osteopathic Medicine | Admitting: Osteopathic Medicine

## 2019-05-11 ENCOUNTER — Other Ambulatory Visit: Payer: Self-pay

## 2019-05-11 DIAGNOSIS — R928 Other abnormal and inconclusive findings on diagnostic imaging of breast: Secondary | ICD-10-CM

## 2019-05-11 DIAGNOSIS — N6311 Unspecified lump in the right breast, upper outer quadrant: Secondary | ICD-10-CM | POA: Diagnosis not present

## 2019-05-11 DIAGNOSIS — R922 Inconclusive mammogram: Secondary | ICD-10-CM | POA: Diagnosis not present

## 2019-05-19 ENCOUNTER — Encounter: Payer: Self-pay | Admitting: Osteopathic Medicine

## 2020-03-29 ENCOUNTER — Other Ambulatory Visit: Payer: Self-pay | Admitting: Osteopathic Medicine

## 2020-04-24 ENCOUNTER — Other Ambulatory Visit: Payer: Self-pay | Admitting: Osteopathic Medicine

## 2020-04-25 ENCOUNTER — Other Ambulatory Visit: Payer: Self-pay | Admitting: Osteopathic Medicine

## 2020-05-18 ENCOUNTER — Other Ambulatory Visit: Payer: Self-pay | Admitting: Osteopathic Medicine

## 2020-06-08 ENCOUNTER — Other Ambulatory Visit: Payer: Self-pay | Admitting: Osteopathic Medicine

## 2020-06-10 ENCOUNTER — Other Ambulatory Visit: Payer: Self-pay | Admitting: Osteopathic Medicine

## 2020-06-20 ENCOUNTER — Encounter: Payer: Self-pay | Admitting: Osteopathic Medicine

## 2020-06-20 ENCOUNTER — Other Ambulatory Visit: Payer: Self-pay | Admitting: Osteopathic Medicine

## 2020-06-20 ENCOUNTER — Other Ambulatory Visit: Payer: Self-pay

## 2020-06-20 ENCOUNTER — Ambulatory Visit (INDEPENDENT_AMBULATORY_CARE_PROVIDER_SITE_OTHER): Payer: Medicaid Other | Admitting: Osteopathic Medicine

## 2020-06-20 VITALS — BP 178/90 | HR 72 | Temp 98.6°F | Wt 149.1 lb

## 2020-06-20 DIAGNOSIS — M79602 Pain in left arm: Secondary | ICD-10-CM

## 2020-06-20 DIAGNOSIS — R7302 Impaired glucose tolerance (oral): Secondary | ICD-10-CM

## 2020-06-20 DIAGNOSIS — E785 Hyperlipidemia, unspecified: Secondary | ICD-10-CM | POA: Diagnosis not present

## 2020-06-20 DIAGNOSIS — M810 Age-related osteoporosis without current pathological fracture: Secondary | ICD-10-CM

## 2020-06-20 DIAGNOSIS — I10 Essential (primary) hypertension: Secondary | ICD-10-CM

## 2020-06-20 MED ORDER — LOSARTAN POTASSIUM 50 MG PO TABS: 50.0000 mg | ORAL_TABLET | Freq: Every day | ORAL | 3 refills | Status: DC

## 2020-06-20 MED ORDER — ATORVASTATIN CALCIUM 80 MG PO TABS: 80.0000 mg | ORAL_TABLET | Freq: Every day | ORAL | 3 refills | Status: DC

## 2020-06-20 MED ORDER — ALENDRONATE SODIUM 10 MG PO TABS: 10.0000 mg | ORAL_TABLET | Freq: Every day | ORAL | 3 refills | Status: DC

## 2020-06-20 MED ORDER — HYDROCHLOROTHIAZIDE 12.5 MG PO TABS: 12.5000 mg | ORAL_TABLET | Freq: Every day | ORAL | 3 refills | Status: DC

## 2020-06-20 NOTE — Progress Notes (Signed)
Katrina Baird is a 70 y.o. female who [facility]  [facility]  [facility], 06/20/20, seeking care for the following:  . Pain in L arm few weeks: foraerm hurts w/ resisted pronation/supination, wrist pain w/ flexion, occasinoal shoudler pain. NO chest pain. Associated timing w/ pickup up small baby a lot lately as well as running out of blood pressure medications.   BP Readings from Last 3 Encounters:  06/20/20 (!) 178/90  03/18/19 138/77  10/22/18 123/86     ASSESSMENT & PLAN with other pertinent findings:  The primary encounter diagnosis was Essential hypertension, benign. Diagnoses of Left arm pain, Glucose intolerance (impaired glucose tolerance), Osteoporosis without current pathological fracture, unspecified osteoporosis type, and Hyperlipidemia with target LDL less than 130 were also pertinent to this visit.    Patient Instructions   Medications taking daily:  Medications  . OSTEOPOROSIS / PREVENT BROKEN BONES: alendronate (FOSAMAX) 10 MG tablet    Sig: Take 1 tablet (10 mg total) by mouth daily before breakfast. Take with a full glass of water on an empty stomach.    Dispense:  90 tablet    Refill:  3  . CHOLESTEROL atorvastatin (LIPITOR) 80 MG tablet    Sig: Take 1 tablet (80 mg total) by mouth daily.    Dispense:  90 tablet    Refill:  3  . BLOOD PRESSURE hydrochlorothiazide (HYDRODIURIL) 12.5 MG tablet    Sig: Take 1 tablet (12.5 mg total) by mouth daily.    Dispense:  90 tablet    Refill:  3  . BLOOD PRESSURE losartan (COZAAR) 50 MG tablet    Sig: Take 1 tablet (50 mg total) by mouth daily.    Dispense:  90 tablet    Refill:  3   ARM PAIN: See attached instructions. I think this is tendon strain in forearm. Try to rest the arm as much as you can. If not better with home exercises, please call us [phone] to schedule a visit with Dr T, our orthopedic / sports medicine doctor to further evaluate.   BLOOD  PRESSURE: Restart medications as above. Check blood pressure at home. Goal top number 140 or less, bottom number 90 or less.      Orders Placed This Encounter  Procedures  . CBC  . COMPLETE METABOLIC PANEL WITH GFR  . Lipid panel  . Hemoglobin A1c  . VITAMIN D 25 Hydroxy (Vit-D Deficiency, Fractures)    Meds ordered this encounter  Medications  . alendronate (FOSAMAX) 10 MG tablet    Sig: Take 1 tablet (10 mg total) by mouth daily before breakfast. Take with a full glass of water on an empty stomach.    Dispense:  90 tablet    Refill:  3  . atorvastatin (LIPITOR) 80 MG tablet    Sig: Take 1 tablet (80 mg total) by mouth daily.    Dispense:  90 tablet    Refill:  3  . hydrochlorothiazide (HYDRODIURIL) 12.5 MG tablet    Sig: Take 1 tablet (12.5 mg total) by mouth daily.    Dispense:  90 tablet    Refill:  3  . losartan (COZAAR) 50 MG tablet    Sig: Take 1 tablet (50 mg total) by mouth daily.    Dispense:  90 tablet    Refill:  3     See below for relevant physical exam findings  See below for recent lab and imaging results reviewed  Medications, allergies,  PMH, PSH, SocH, FamH reviewed below    Follow-up instructions: Return if arm pains worsen or fail to improve. Nurse visit BP recheck in 1 month..                                        Exam:  BP (!) 178/90 (BP Location: Left Arm, Patient Position: Sitting, Cuff Size: Normal)   Pulse 72   Temp 98.6 F (37 C) (Oral)   Wt 149 lb 1.9 oz (67.6 kg)   BMI 24.81 kg/m   Constitutional: VS see above. General Appearance: alert, well-developed, well-nourished, NAD  Neck: No masses, trachea midline.   Respiratory: Normal respiratory effort. no wheeze, no rhonchi, no rales  Cardiovascular: S1/S2 normal, no murmur, no rub/gallop auscultated. RRR.   Musculoskeletal: Gait normal. Symmetric and independent movement of all extremities  Abdominal: non-tender, non-distended, no  appreciable organomegaly, neg Murphy's, BS WNLx4  Neurological: Normal balance/coordination. No tremor.  Skin: warm, dry, intact.   Psychiatric: Normal judgment/insight. Normal mood and affect. Oriented x3.   Current Meds  Medication Sig  . [DISCONTINUED] alendronate (FOSAMAX) 10 MG tablet TAKE 1 TABLET BY MOUTH EVERY DAY BEFORE BREAKFAST  . [DISCONTINUED] atorvastatin (LIPITOR) 80 MG tablet TAKE 1 TABLET BY MOUTH EVERY DAY. MAKE ANNUAL APPOINTMENT FOR FUTURE REFILLS  . [DISCONTINUED] diclofenac Sodium (VOLTAREN) 1 % GEL Apply 4 g topically 4 (four) times daily. To affected joint.  . [DISCONTINUED] hydrochlorothiazide (HYDRODIURIL) 12.5 MG tablet TAKE ONE TABLET BY MOUTH DAILY, NEED APPOINTMENT FOR FUTURE REFILLS  . [DISCONTINUED] losartan (COZAAR) 50 MG tablet Take 1 tablet (50 mg total) by mouth daily.  . [DISCONTINUED] meclizine (ANTIVERT) 12.5 MG tablet Take 1 tablet (12.5 mg total) by mouth 3 (three) times daily as needed for dizziness or nausea.  . [DISCONTINUED] potassium chloride (KLOR-CON) 10 MEQ tablet TAKE TWO TABLETS BY MOUTH DAILY, NEED APPOINTMENT FOR FUTURE REFILLS    No Known Allergies  Patient Active Problem List   Diagnosis Date Noted  . Glucose intolerance (impaired glucose tolerance) 06/20/2020  . Osteoporosis 02/03/2015  . Axillary lump 01/31/2015  . Colon cancer screening 03/30/2014  . Visit for screening mammogram 03/08/2013  . Insomnia 03/08/2013  . Thoracic back pain 09/23/2012  . Essential hypertension, benign 09/23/2012  . Hyperlipidemia with target LDL less than 130 09/23/2012    Family History  Problem Relation Age of Onset  . Cancer Neg Hx   . Diabetes Neg Hx   . Early death Neg Hx   . Heart disease Neg Hx   . Hyperlipidemia Neg Hx   . Hypertension Neg Hx   . Kidney disease Neg Hx   . Stroke Neg Hx     Social History   Tobacco Use  Smoking Status Never Smoker  Smokeless Tobacco Never Used    No past surgical history on  file.  Immunization History  Administered Date(s) Administered  . Influenza, High Dose Seasonal PF 11/02/2012, 12/22/2017  . Influenza,inj,Quad PF,6+ Mos 11/08/2013, 01/31/2015, 09/21/2015  . Moderna Sars-Covid-2 Vaccination 03/15/2019, 04/12/2019  . PFIZER(Purple Top)SARS-COV-2 Vaccination 11/22/2019  . Pneumococcal Conjugate-13 03/30/2014  . Pneumococcal Polysaccharide-23 03/08/2013  . Tdap 11/02/2012  . Zoster, Live 03/08/2013    Recent Results (from the past 2160 hour(s))  Lipid panel     Status: None   Collection Time: 06/20/20 12:00 AM  Result Value Ref Range   Cholesterol 167 <200 mg/dL   HDL 51 >  OR = 50 mg/dL   Triglycerides 825 <053 mg/dL   LDL Cholesterol (Calc) 95 mg/dL (calc)    Comment: Reference range: <100 . Desirable range <100 mg/dL for primary prevention;   <70 mg/dL for patients with CHD or diabetic patients  with > or = 2 CHD risk factors. Marland Kitchen LDL-C is now calculated using the Martin-Hopkins  calculation, which is a validated novel method providing  better accuracy than the Friedewald equation in the  estimation of LDL-C.  Horald Pollen et al. Lenox Ahr. 9767;341(93): 2061-2068  (http://education.[facility].com/faq/FAQ164)    Total CHOL/HDL Ratio 3.3 <5.0 (calc)   Non-HDL Cholesterol (Calc) 116 <130 mg/dL (calc)    Comment: For patients with diabetes plus 1 major ASCVD risk  factor, treating to a non-HDL-C goal of <100 mg/dL  (LDL-C of <79 mg/dL) is considered a therapeutic  option.   COMPLETE METABOLIC PANEL WITH GFR     Status: Abnormal   Collection Time: 06/20/20 12:00 AM  Result Value Ref Range   Glucose, Bld 104 (H) 65 - 99 mg/dL    Comment: .            Fasting reference interval . For someone without known diabetes, a glucose value between 100 and 125 mg/dL is consistent with prediabetes and should be confirmed with a follow-up test. .    BUN 16 7 - 25 mg/dL   Creat 0.24 0.97 - 3.53 mg/dL    Comment: For patients >71 years of age, the  reference limit for Creatinine is approximately 13% higher for people identified as African-American. .    GFR, Est Non African American 80 > OR = 60 mL/min/1.68m2   GFR, Est African American 92 > OR = 60 mL/min/1.5m2   BUN/Creatinine Ratio NOT APPLICABLE 6 - 22 (calc)   Sodium 143 135 - 146 mmol/L   Potassium 3.3 (L) 3.5 - 5.3 mmol/L   Chloride 105 98 - 110 mmol/L   CO2 27 20 - 32 mmol/L   Calcium 9.4 8.6 - 10.4 mg/dL   Total Protein 7.3 6.1 - 8.1 g/dL   Albumin 4.2 3.6 - 5.1 g/dL   Globulin 3.1 1.9 - 3.7 g/dL (calc)   AG Ratio 1.4 1.0 - 2.5 (calc)   Total Bilirubin 1.2 0.2 - 1.2 mg/dL   Alkaline phosphatase (APISO) 36 (L) 37 - 153 U/L   AST 20 10 - 35 U/L   ALT 25 6 - 29 U/L  Hemoglobin A1c     Status: Abnormal   Collection Time: 06/20/20 12:00 AM  Result Value Ref Range   Hgb A1c MFr Bld 6.0 (H) <5.7 % of total Hgb    Comment: For someone without known diabetes, a hemoglobin  A1c value between 5.7% and 6.4% is consistent with prediabetes and should be confirmed with a  follow-up test. . For someone with known diabetes, a value <7% indicates that their diabetes is well controlled. A1c targets should be individualized based on duration of diabetes, age, comorbid conditions, and other considerations. . This assay result is consistent with an increased risk of diabetes. . Currently, no consensus exists regarding use of hemoglobin A1c for diagnosis of diabetes for children. .    Mean Plasma Glucose 126 mg/dL   eAG (mmol/L) 7.0 mmol/L  VITAMIN D 25 Hydroxy (Vit-D Deficiency, Fractures)     Status: None   Collection Time: 06/20/20 12:00 AM  Result Value Ref Range   Vit D, 25-Hydroxy 39 30 - 100 ng/mL    Comment: Vitamin D Status  25-OH Vitamin D: . Deficiency:                    <20 ng/mL Insufficiency:             20 - 29 ng/mL Optimal:                 > or = 30 ng/mL . For 25-OH Vitamin D testing on patients on  D2-supplementation and patients for whom  quantitation  of D2 and D3 fractions is required, the [facility](TM) 25-OH VIT D, (D2,D3), LC/MS/MS is recommended: order  code 94765 (patients >66yrs). See Note 1 . Note 1 . For additional information, please refer to  http://education.[facility].com/faq/FAQ199  (This link is being provided for informational/ educational purposes only.)   CBC     Status: Abnormal   Collection Time: 06/20/20 12:00 AM  Result Value Ref Range   WBC 6.6 3.8 - 10.8 Thousand/uL   RBC 5.55 (H) 3.80 - 5.10 Million/uL   Hemoglobin 14.2 11.7 - 15.5 g/dL   HCT 46.5 03.5 - 46.5 %   MCV 78.0 (L) 80.0 - 100.0 fL   MCH 25.6 (L) 27.0 - 33.0 pg   MCHC 32.8 32.0 - 36.0 g/dL   RDW 68.1 27.5 - 17.0 %   Platelets 293 140 - 400 Thousand/uL   MPV 12.5 7.5 - 12.5 fL    No results found.     All questions at time of visit were answered - patient instructed to contact office with any additional concerns or updates. ER/RTC precautions were reviewed with the patient as applicable.   Please note: manual typing as well as voice recognition software may have been used to produce this document - typos may escape review. Please contact Dr. Lyn Hollingshead for any needed clarifications.

## 2020-06-20 NOTE — Patient Instructions (Addendum)
Medications taking daily:  Medications  . OSTEOPOROSIS / PREVENT BROKEN BONES: alendronate (FOSAMAX) 10 MG tablet    Sig: Take 1 tablet (10 mg total) by mouth daily before breakfast. Take with a full glass of water on an empty stomach.    Dispense:  90 tablet    Refill:  3  . CHOLESTEROL atorvastatin (LIPITOR) 80 MG tablet    Sig: Take 1 tablet (80 mg total) by mouth daily.    Dispense:  90 tablet    Refill:  3  . BLOOD PRESSURE hydrochlorothiazide (HYDRODIURIL) 12.5 MG tablet    Sig: Take 1 tablet (12.5 mg total) by mouth daily.    Dispense:  90 tablet    Refill:  3  . BLOOD PRESSURE losartan (COZAAR) 50 MG tablet    Sig: Take 1 tablet (50 mg total) by mouth daily.    Dispense:  90 tablet    Refill:  3   ARM PAIN: See attached instructions. I think this is tendon strain in forearm. Try to rest the arm as much as you can. If not better with home exercises, please call us [phone] to schedule a visit with Dr T, our orthopedic / sports medicine doctor to further evaluate.   BLOOD PRESSURE: Restart medications as above. Check blood pressure at home. Goal top number 140 or less, bottom number 90 or less.

## 2020-06-21 ENCOUNTER — Other Ambulatory Visit: Payer: Self-pay | Admitting: Osteopathic Medicine

## 2020-06-21 DIAGNOSIS — E876 Hypokalemia: Secondary | ICD-10-CM

## 2020-06-21 LAB — LIPID PANEL
Cholesterol: 167 mg/dL (ref <200)
HDL: 51 mg/dL (ref >=50)
LDL Cholesterol (Calc): 95 mg/dL (calc)
Non-HDL Cholesterol (Calc): 116 mg/dL (calc) (ref <130)
Total CHOL/HDL Ratio: 3.3 (calc) (ref <5.0)
Triglycerides: 111 mg/dL (ref <150)

## 2020-06-21 LAB — CBC
HCT: 43.3 % (ref 35.0–45.0)
Hemoglobin: 14.2 g/dL (ref 11.7–15.5)
MCH: 25.6 pg — ABNORMAL LOW (ref 27.0–33.0)
MCHC: 32.8 g/dL (ref 32.0–36.0)
MCV: 78 fL — ABNORMAL LOW (ref 80.0–100.0)
MPV: 12.5 fL (ref 7.5–12.5)
Platelets: 293 10*3/uL (ref 140–400)
RBC: 5.55 10*6/uL — ABNORMAL HIGH (ref 3.80–5.10)
RDW: 14.2 % (ref 11.0–15.0)
WBC: 6.6 10*3/uL (ref 3.8–10.8)

## 2020-06-21 LAB — COMPLETE METABOLIC PANEL WITH GFR
AG Ratio: 1.4 (calc) (ref 1.0–2.5)
ALT: 25 U/L (ref 6–29)
AST: 20 U/L (ref 10–35)
Albumin: 4.2 g/dL (ref 3.6–5.1)
Alkaline phosphatase (APISO): 36 U/L — ABNORMAL LOW (ref 37–153)
BUN: 16 mg/dL (ref 7–25)
CO2: 27 mmol/L (ref 20–32)
Calcium: 9.4 mg/dL (ref 8.6–10.4)
Chloride: 105 mmol/L (ref 98–110)
Creat: 0.75 mg/dL (ref 0.60–0.93)
GFR, Est African American: 92 mL/min/{1.73_m2} (ref >=60)
GFR, Est Non African American: 80 mL/min/{1.73_m2} (ref >=60)
Globulin: 3.1 g/dL (calc) (ref 1.9–3.7)
Glucose, Bld: 104 mg/dL — ABNORMAL HIGH (ref 65–99)
Potassium: 3.3 mmol/L — ABNORMAL LOW (ref 3.5–5.3)
Sodium: 143 mmol/L (ref 135–146)
Total Bilirubin: 1.2 mg/dL (ref 0.2–1.2)
Total Protein: 7.3 g/dL (ref 6.1–8.1)

## 2020-06-21 LAB — VITAMIN D 25 HYDROXY (VIT D DEFICIENCY, FRACTURES): Vit D, 25-Hydroxy: 39 ng/mL (ref 30–100)

## 2020-06-21 LAB — HEMOGLOBIN A1C
Hgb A1c MFr Bld: 6 % of total Hgb — ABNORMAL HIGH (ref <5.7)
Mean Plasma Glucose: 126 mg/dL
eAG (mmol/L): 7 mmol/L

## 2020-06-21 MED ORDER — POTASSIUM CHLORIDE ER 10 MEQ PO TBCR: 20.0000 meq | EXTENDED_RELEASE_TABLET | Freq: Every day | ORAL | 3 refills | Status: DC

## 2020-07-24 ENCOUNTER — Other Ambulatory Visit: Payer: Self-pay

## 2020-07-24 ENCOUNTER — Ambulatory Visit: Payer: Medicaid Other

## 2020-07-24 ENCOUNTER — Ambulatory Visit (INDEPENDENT_AMBULATORY_CARE_PROVIDER_SITE_OTHER): Payer: Medicaid Other | Admitting: Osteopathic Medicine

## 2020-07-24 VITALS — BP 137/86 | HR 78

## 2020-07-24 DIAGNOSIS — I10 Essential (primary) hypertension: Secondary | ICD-10-CM | POA: Diagnosis not present

## 2020-07-24 NOTE — Progress Notes (Signed)
Established Patient Office Visit  Subjective:  Patient ID: Katrina Baird, female    DOB: 1950-08-09  Age: 70 y.o. MRN: [id number]  CC:  Chief Complaint  Patient presents with   Hypertension    HPI Katrina Baird presents for blood pressure check. She reports taking her blood pressure medications. Denies chest pain, shortness of breath or dizziness.   Past Medical History:  Diagnosis Date   Hyperlipidemia with target LDL less than 130 09/23/2012   Hypertension    Osteoporosis 02/03/2015   Vertigo     History reviewed. No pertinent surgical history.  Family History  Problem Relation Age of Onset   Cancer Neg Hx    Diabetes Neg Hx    Early death Neg Hx    Heart disease Neg Hx    Hyperlipidemia Neg Hx    Hypertension Neg Hx    Kidney disease Neg Hx    Stroke Neg Hx     Social History   Socioeconomic History   Marital status: Married    Spouse name: Not on file   Number of children: Not on file   Years of education: Not on file   Highest education level: Not on file  Occupational History   Not on file  Tobacco Use   Smoking status: Never   Smokeless tobacco: Never  Substance and Sexual Activity   Alcohol use: No   Drug use: No   Sexual activity: Not Currently  Other Topics Concern   Not on file  Social History Narrative   Not on file   Social Determinants of Health   Financial Resource Strain: Not on file  Food Insecurity: Not on file  Transportation Needs: Not on file  Physical Activity: Not on file  Stress: Not on file  Social Connections: Not on file  Intimate Partner Violence: Not on file    Outpatient Medications Prior to Visit  Medication Sig Dispense Refill   alendronate (FOSAMAX) 10 MG tablet Take 1 tablet (10 mg total) by mouth daily before breakfast. Take with a full glass of water on an empty stomach. 90 tablet 3   atorvastatin (LIPITOR) 80 MG tablet Take 1 tablet (80 mg total) by mouth daily. 90 tablet 3   hydrochlorothiazide (HYDRODIURIL) 12.5 MG  tablet Take 1 tablet (12.5 mg total) by mouth daily. 90 tablet 3   losartan (COZAAR) 50 MG tablet Take 1 tablet (50 mg total) by mouth daily. 90 tablet 3   potassium chloride (KLOR-CON) 10 MEQ tablet Take 2 tablets (20 mEq total) by mouth daily. 180 tablet 3   No facility-administered medications prior to visit.    No Known Allergies  ROS Review of Systems    Objective:    Physical Exam  BP 137/86   Pulse 78   SpO2 98%  Wt Readings from Last 3 Encounters:  06/20/20 149 lb 1.9 oz (67.6 kg)  03/18/19 151 lb 0.6 oz (68.5 kg)  03/04/18 130 lb 3.2 oz (59.1 kg)     Health Maintenance Due  Topic Date Due   COLONOSCOPY (Pts 45-44yrs Insurance coverage will need to be confirmed)  Never done   COVID-19 Vaccine (4 - Booster for Moderna series) 03/21/2020    There are no preventive care reminders to display for this patient.  Lab Results  Component Value Date   TSH 0.81 03/30/2014   Lab Results  Component Value Date   WBC 6.6 06/20/2020   HGB 14.2 06/20/2020   HCT 43.3 06/20/2020   MCV 78.0 (  L) 06/20/2020   PLT 293 06/20/2020   Lab Results  Component Value Date   NA 143 06/20/2020   K 3.3 (L) 06/20/2020   CO2 27 06/20/2020   GLUCOSE 104 (H) 06/20/2020   BUN 16 06/20/2020   CREATININE 0.75 06/20/2020   BILITOT 1.2 06/20/2020   ALKPHOS 39 01/31/2015   AST 20 06/20/2020   ALT 25 06/20/2020   PROT 7.3 06/20/2020   ALBUMIN 4.1 01/31/2015   CALCIUM 9.4 06/20/2020   GFR 83.42 03/30/2014   Lab Results  Component Value Date   CHOL 167 06/20/2020   Lab Results  Component Value Date   HDL 51 06/20/2020   Lab Results  Component Value Date   LDLCALC 95 06/20/2020   Lab Results  Component Value Date   TRIG 111 06/20/2020   Lab Results  Component Value Date   CHOLHDL 3.3 06/20/2020   Lab Results  Component Value Date   HGBA1C 6.0 (H) 06/20/2020      Assessment & Plan:  HTN- Patient advised to continue to take medications as prescribed. Follow up in 3  months with Dr Lyn Hollingshead.    Problem List Items Addressed This Visit     Essential hypertension, benign - Primary    No orders of the defined types were placed in this encounter.   Follow-up: Return in about 3 months (around 10/24/2020) for HTN with Dr Lyn Hollingshead. Earna Coder, Janalyn Harder, CMA

## 2020-07-25 ENCOUNTER — Ambulatory Visit: Payer: Medicaid Other | Admitting: Osteopathic Medicine

## 2020-07-25 ENCOUNTER — Encounter: Payer: Self-pay | Admitting: Osteopathic Medicine

## 2020-07-25 ENCOUNTER — Ambulatory Visit (INDEPENDENT_AMBULATORY_CARE_PROVIDER_SITE_OTHER): Payer: Medicaid Other | Admitting: Osteopathic Medicine

## 2020-07-25 VITALS — BP 131/85 | HR 85 | Ht 65.0 in | Wt 149.6 lb

## 2020-07-25 DIAGNOSIS — M79602 Pain in left arm: Secondary | ICD-10-CM | POA: Diagnosis not present

## 2020-07-25 DIAGNOSIS — T753XXA Motion sickness, initial encounter: Secondary | ICD-10-CM

## 2020-07-25 DIAGNOSIS — I10 Essential (primary) hypertension: Secondary | ICD-10-CM | POA: Diagnosis not present

## 2020-07-25 MED ORDER — ONDANSETRON 8 MG PO TBDP
8.0000 mg | ORAL_TABLET | Freq: Three times a day (TID) | ORAL | 0 refills | Status: DC | PRN
Start: 2020-07-25 — End: 2021-01-22

## 2020-07-25 NOTE — Progress Notes (Signed)
Katrina Baird is a 70 y.o. female who presents to  [facility]  today, 07/25/20, seeking care for the following:  HTN follow-up, BP looking great  L arm pain, we discussed this in 06/20/2020 and advised sports med follow-up if no better. She is here with me today to recheck this. See below.  Motion sickness in the car, requesting prn medications.       ASSESSMENT & PLAN with other pertinent findings:  The primary encounter diagnosis was Essential hypertension, benign. Diagnoses of Left arm pain and Motion sickness, initial encounter were also pertinent to this visit.   1. Essential hypertension, benign Stable, continue current meds  2. Left arm pain Running from deltoid to forearm, no joint pain, reports pain with extension/abduction and with elbow flexion. Nonspecific exam, normal strength. Will refer to PT for evaluation but I think she probably needs sports med input   3. Motion sickness, initial encounter Prn Rx sent   There are no Patient Instructions on file for this visit.  No orders of the defined types were placed in this encounter.   Meds ordered this encounter  Medications   ondansetron (ZOFRAN ODT) 8 MG disintegrating tablet    Sig: Take 1 tablet (8 mg total) by mouth every 8 (eight) hours as needed for nausea or vomiting. Or motion sickness (prior to long car rides)    Dispense:  30 tablet    Refill:  0     See below for relevant physical exam findings  See below for recent lab and imaging results reviewed  Medications, allergies, PMH, PSH, SocH, FamH reviewed below    Follow-up instructions: Return in about 6 months (around 01/25/2021) for MONITOR BP - CAN CANCEL 3 MOS FOLLOW UP. PT REFERRAL. DR T FOR ARM PAIN IF NO BETTER .                                        Exam:  BP 131/85   Pulse 85   Ht 5\' 5"  (1.651 m)   Wt 149 lb 9.6 oz (67.9 kg)   SpO2 97%   BMI 24.89  kg/m  Constitutional: VS see above. General Appearance: alert, well-developed, well-nourished, NAD Neck: No masses, trachea midline.  Respiratory: Normal respiratory effort. no wheeze, no rhonchi, no rales Cardiovascular: S1/S2 normal, no murmur, no rub/gallop auscultated. RRR.  Musculoskeletal: Gait normal. Symmetric and independent movement of all extremities. See above re L arm Abdominal: non-tender, non-distended, no appreciable organomegaly, neg Murphy's, BS [id number] Neurological: Normal balance/coordination. No tremor. Skin: warm, dry, intact.  Psychiatric: Normal judgment/insight. Normal mood and affect. Oriented x3.   Current Meds  Medication Sig   alendronate (FOSAMAX) 10 MG tablet Take 1 tablet (10 mg total) by mouth daily before breakfast. Take with a full glass of water on an empty stomach.   atorvastatin (LIPITOR) 80 MG tablet Take 1 tablet (80 mg total) by mouth daily.   hydrochlorothiazide (HYDRODIURIL) 12.5 MG tablet Take 1 tablet (12.5 mg total) by mouth daily.   losartan (COZAAR) 50 MG tablet Take 1 tablet (50 mg total) by mouth daily.   ondansetron (ZOFRAN ODT) 8 MG disintegrating tablet Take 1 tablet (8 mg total) by mouth every 8 (eight) hours as needed for nausea or vomiting. Or motion sickness (prior to long car rides)   potassium chloride (KLOR-CON) 10 MEQ tablet Take 2  tablets (20 mEq total) by mouth daily.    No Known Allergies  Patient Active Problem List   Diagnosis Date Noted   Glucose intolerance (impaired glucose tolerance) 06/20/2020   Osteoporosis 02/03/2015   Axillary lump 01/31/2015   Colon cancer screening 03/30/2014   Visit for screening mammogram 03/08/2013   Insomnia 03/08/2013   Thoracic back pain 09/23/2012   Essential hypertension, benign 09/23/2012   Hyperlipidemia with target LDL less than 130 09/23/2012    Family History  Problem Relation Age of Onset   Cancer Neg Hx    Diabetes Neg Hx    Early death Neg Hx    Heart disease Neg Hx     Hyperlipidemia Neg Hx    Hypertension Neg Hx    Kidney disease Neg Hx    Stroke Neg Hx     Social History   Tobacco Use  Smoking Status Never  Smokeless Tobacco Never    No past surgical history on file.  Immunization History  Administered Date(s) Administered   Influenza, High Dose Seasonal PF 11/02/2012, 12/22/2017   Influenza,inj,Quad PF,6+ Mos 11/08/2013, 01/31/2015, 09/21/2015   Moderna Sars-Covid-2 Vaccination 03/15/2019, 04/12/2019   PFIZER(Purple Top)SARS-COV-2 Vaccination 11/22/2019   Pneumococcal Conjugate-13 03/30/2014   Pneumococcal Polysaccharide-23 03/08/2013   Tdap 11/02/2012   Zoster, Live 03/08/2013    Recent Results (from the past 2160 hour(s))  Lipid panel     Status: None   Collection Time: 06/20/20 12:00 AM  Result Value Ref Range   Cholesterol 167 <200 mg/dL   HDL 51 > OR = 50 mg/dL   Triglycerides 976 <734 mg/dL   LDL Cholesterol (Calc) 95 mg/dL (calc)    Comment: Reference range: <100 . Desirable range <100 mg/dL for primary prevention;   <70 mg/dL for patients with CHD or diabetic patients  with > or = 2 CHD risk factors. Marland Kitchen LDL-C is now calculated using the Martin-Hopkins  calculation, which is a validated novel method providing  better accuracy than the Friedewald equation in the  estimation of LDL-C.  Horald Pollen et al. Lenox Ahr. 1937;902(40): 2061-2068  (http://education.[facility].com/faq/FAQ164)    Total CHOL/HDL Ratio 3.3 <5.0 (calc)   Non-HDL Cholesterol (Calc) 116 <130 mg/dL (calc)    Comment: For patients with diabetes plus 1 major ASCVD risk  factor, treating to a non-HDL-C goal of <100 mg/dL  (LDL-C of <97 mg/dL) is considered a therapeutic  option.   COMPLETE METABOLIC PANEL WITH GFR     Status: Abnormal   Collection Time: 06/20/20 12:00 AM  Result Value Ref Range   Glucose, Bld 104 (H) 65 - 99 mg/dL    Comment: .            Fasting reference interval . For someone without known diabetes, a glucose value between 100  and 125 mg/dL is consistent with prediabetes and should be confirmed with a follow-up test. .    BUN 16 7 - 25 mg/dL   Creat 3.53 2.99 - 2.42 mg/dL    Comment: For patients >41 years of age, the reference limit for Creatinine is approximately 13% higher for people identified as African-American. .    GFR, Est Non African American 80 > OR = 60 mL/min/1.44m2   GFR, Est African American 92 > OR = 60 mL/min/1.72m2   BUN/Creatinine Ratio NOT APPLICABLE 6 - 22 (calc)   Sodium 143 135 - 146 mmol/L   Potassium 3.3 (L) 3.5 - 5.3 mmol/L   Chloride 105 98 - 110 mmol/L   CO2 27  20 - 32 mmol/L   Calcium 9.4 8.6 - 10.4 mg/dL   Total Protein 7.3 6.1 - 8.1 g/dL   Albumin 4.2 3.6 - 5.1 g/dL   Globulin 3.1 1.9 - 3.7 g/dL (calc)   AG Ratio 1.4 1.0 - 2.5 (calc)   Total Bilirubin 1.2 0.2 - 1.2 mg/dL   Alkaline phosphatase (APISO) 36 (L) 37 - 153 U/L   AST 20 10 - 35 U/L   ALT 25 6 - 29 U/L  Hemoglobin A1c     Status: Abnormal   Collection Time: 06/20/20 12:00 AM  Result Value Ref Range   Hgb A1c MFr Bld 6.0 (H) <5.7 % of total Hgb    Comment: For someone without known diabetes, a hemoglobin  A1c value between 5.7% and 6.4% is consistent with prediabetes and should be confirmed with a  follow-up test. . For someone with known diabetes, a value <7% indicates that their diabetes is well controlled. A1c targets should be individualized based on duration of diabetes, age, comorbid conditions, and other considerations. . This assay result is consistent with an increased risk of diabetes. . Currently, no consensus exists regarding use of hemoglobin A1c for diagnosis of diabetes for children. .    Mean Plasma Glucose 126 mg/dL   eAG (mmol/L) 7.0 mmol/L  VITAMIN D 25 Hydroxy (Vit-D Deficiency, Fractures)     Status: None   Collection Time: 06/20/20 12:00 AM  Result Value Ref Range   Vit D, 25-Hydroxy 39 30 - 100 ng/mL    Comment: Vitamin D Status         25-OH Vitamin D: . Deficiency:                     <20 ng/mL Insufficiency:             20 - 29 ng/mL Optimal:                 > or = 30 ng/mL . For 25-OH Vitamin D testing on patients on  D2-supplementation and patients for whom quantitation  of D2 and D3 fractions is required, the [facility](TM) 25-OH VIT D, (D2,D3), LC/MS/MS is recommended: order  code 62376 (patients >45yrs). See Note 1 . Note 1 . For additional information, please refer to  http://education.[facility].com/faq/FAQ199  (This link is being provided for informational/ educational purposes only.)   CBC     Status: Abnormal   Collection Time: 06/20/20 12:00 AM  Result Value Ref Range   WBC 6.6 3.8 - 10.8 Thousand/uL   RBC 5.55 (H) 3.80 - 5.10 Million/uL   Hemoglobin 14.2 11.7 - 15.5 g/dL   HCT 28.3 15.1 - 76.1 %   MCV 78.0 (L) 80.0 - 100.0 fL   MCH 25.6 (L) 27.0 - 33.0 pg   MCHC 32.8 32.0 - 36.0 g/dL   RDW 60.7 37.1 - 06.2 %   Platelets 293 140 - 400 Thousand/uL   MPV 12.5 7.5 - 12.5 fL    No results found.     All questions at time of visit were answered - patient instructed to contact office with any additional concerns or updates. ER/RTC precautions were reviewed with the patient as applicable.   Please note: manual typing as well as voice recognition software may have been used to produce this document - typos may escape review. Please contact Dr. Lyn Hollingshead for any needed clarifications.

## 2020-07-27 ENCOUNTER — Ambulatory Visit: Payer: Medicaid Other | Admitting: Osteopathic Medicine

## 2020-11-27 ENCOUNTER — Encounter: Payer: Self-pay | Admitting: Family Medicine

## 2020-11-27 ENCOUNTER — Other Ambulatory Visit: Payer: Self-pay

## 2020-11-27 ENCOUNTER — Ambulatory Visit: Payer: Medicaid Other | Admitting: Family Medicine

## 2020-11-27 VITALS — BP 155/93 | HR 91 | Temp 98.9°F | Wt 150.0 lb

## 2020-11-27 DIAGNOSIS — M7522 Bicipital tendinitis, left shoulder: Secondary | ICD-10-CM

## 2020-11-27 DIAGNOSIS — I10 Essential (primary) hypertension: Secondary | ICD-10-CM

## 2020-11-27 MED ORDER — MELOXICAM 15 MG PO TABS: 15.0000 mg | ORAL_TABLET | Freq: Every day | ORAL | 0 refills | Status: DC

## 2020-11-27 NOTE — Progress Notes (Signed)
Acute Office Visit  Subjective:    Patient ID: Katrina Baird, female    DOB: 1950-11-05, 70 y.o.   MRN: [id number]  Chief Complaint  Patient presents with   Arm Pain    Left     Patient is in today for left forearm pain.   States this has been going on for several months, but is gradually worsening. She reports pain to left lateral forearm from elbow down about halfway of forearm. Reports she has been holding a baby a lot and the radial rotation, supination is most painful, to the point that her arm will start shaking. She has not had any luck with a heating pad. Is sensitive to topical preparations [facility], etc). Pain has become more consistent and severe - for the past 10 days she has been taking ibuprofen BID with no improvement. No numbness/tingling, rashes, other arm pain.   Son present during appointment to translate.      Past Medical History:  Diagnosis Date   Hyperlipidemia with target LDL less than 130 09/23/2012   Hypertension    Osteoporosis 02/03/2015   Vertigo     No past surgical history on file.  Family History  Problem Relation Age of Onset   Cancer Neg Hx    Diabetes Neg Hx    Early death Neg Hx    Heart disease Neg Hx    Hyperlipidemia Neg Hx    Hypertension Neg Hx    Kidney disease Neg Hx    Stroke Neg Hx     Social History   Socioeconomic History   Marital status: Married    Spouse name: Not on file   Number of children: Not on file   Years of education: Not on file   Highest education level: Not on file  Occupational History   Not on file  Tobacco Use   Smoking status: Never   Smokeless tobacco: Never  Substance and Sexual Activity   Alcohol use: No   Drug use: No   Sexual activity: Not Currently  Other Topics Concern   Not on file  Social History Narrative   Not on file   Social Determinants of Health   Financial Resource Strain: Not on file  Food Insecurity: Not on file  Transportation Needs: Not on file  Physical Activity: Not  on file  Stress: Not on file  Social Connections: Not on file  Intimate Partner Violence: Not on file    Outpatient Medications Prior to Visit  Medication Sig Dispense Refill   alendronate (FOSAMAX) 10 MG tablet Take 1 tablet (10 mg total) by mouth daily before breakfast. Take with a full glass of water on an empty stomach. 90 tablet 3   atorvastatin (LIPITOR) 80 MG tablet Take 1 tablet (80 mg total) by mouth daily. 90 tablet 3   hydrochlorothiazide (HYDRODIURIL) 12.5 MG tablet Take 1 tablet (12.5 mg total) by mouth daily. 90 tablet 3   losartan (COZAAR) 50 MG tablet Take 1 tablet (50 mg total) by mouth daily. 90 tablet 3   ondansetron (ZOFRAN ODT) 8 MG disintegrating tablet Take 1 tablet (8 mg total) by mouth every 8 (eight) hours as needed for nausea or vomiting. Or motion sickness (prior to long car rides) 30 tablet 0   potassium chloride (KLOR-CON) 10 MEQ tablet Take 2 tablets (20 mEq total) by mouth daily. 180 tablet 3   No facility-administered medications prior to visit.    No Known Allergies  Review of Systems All review  of systems negative except what is listed in the HPI     Objective:    Physical Exam Vitals reviewed.  Constitutional:      Appearance: Normal appearance. She is normal weight.  HENT:     Head: Normocephalic and atraumatic.  Musculoskeletal:        General: Tenderness present. No swelling. Normal range of motion.     Comments: Left arm with pain over distal biceps, worse with supination (with and without resistance)  Skin:    General: Skin is warm and dry.     Findings: No erythema or rash.  Neurological:     General: No focal deficit present.     Mental Status: She is alert and oriented to person, place, and time. Mental status is at baseline.  Psychiatric:        Mood and Affect: Mood normal.        Behavior: Behavior normal.        Thought Content: Thought content normal.        Judgment: Judgment normal.    BP (!) 155/93 (BP Location: Left  Arm, Patient Position: Sitting, Cuff Size: Normal)   Pulse 91   Temp 98.9 F (37.2 C) (Oral)   Wt 150 lb 0.6 oz (68.1 kg)   SpO2 97%   BMI 24.97 kg/m  Wt Readings from Last 3 Encounters:  11/27/20 150 lb 0.6 oz (68.1 kg)  07/25/20 149 lb 9.6 oz (67.9 kg)  06/20/20 149 lb 1.9 oz (67.6 kg)    Health Maintenance Due  Topic Date Due   COLONOSCOPY (Pts 45-31yrs Insurance coverage will need to be confirmed)  Never done    There are no preventive care reminders to display for this patient.   Lab Results  Component Value Date   TSH 0.81 03/30/2014   Lab Results  Component Value Date   WBC 6.6 06/20/2020   HGB 14.2 06/20/2020   HCT 43.3 06/20/2020   MCV 78.0 (L) 06/20/2020   PLT 293 06/20/2020   Lab Results  Component Value Date   NA 143 06/20/2020   K 3.3 (L) 06/20/2020   CO2 27 06/20/2020   GLUCOSE 104 (H) 06/20/2020   BUN 16 06/20/2020   CREATININE 0.75 06/20/2020   BILITOT 1.2 06/20/2020   ALKPHOS 39 01/31/2015   AST 20 06/20/2020   ALT 25 06/20/2020   PROT 7.3 06/20/2020   ALBUMIN 4.1 01/31/2015   CALCIUM 9.4 06/20/2020   GFR 83.42 03/30/2014   Lab Results  Component Value Date   CHOL 167 06/20/2020   Lab Results  Component Value Date   HDL 51 06/20/2020   Lab Results  Component Value Date   LDLCALC 95 06/20/2020   Lab Results  Component Value Date   TRIG 111 06/20/2020   Lab Results  Component Value Date   CHOLHDL 3.3 06/20/2020   Lab Results  Component Value Date   HGBA1C 6.0 (H) 06/20/2020       Assessment & Plan:    1. Biceps tendinitis of left upper extremity Distal biceps tendinitis.  Stop ibuprofen, change to meloxicam - avoid all other NSAIDs.  Home exercises provided Can use ice/heat as needed for comfort  - meloxicam (MOBIC) 15 MG tablet; Take 1 tablet (15 mg total) by mouth daily.  Dispense: 30 tablet; Refill: 0  2. Essential hypertension, benign BP elevated today. Ran out of meds a few days ago and didn't know she had a  refill at the pharmacy. They are planning  to pick up today and continue to monitor at home. Asymptomatic. Nurse visit in 2 weeks to recheck.   Patient aware of signs/symptoms requiring further/urgent evaluation.   Follow-up in 2 weeks for nurse visit BP check.  Follow-up in 4 weeks for arm pain if not improving.   Please contact office for sooner follow-up if symptoms do not improve or worsen. Seek emergency care if symptoms become severe.  Lollie Marrow Reola Calkins, DNP, FNP-C

## 2020-12-11 ENCOUNTER — Ambulatory Visit (INDEPENDENT_AMBULATORY_CARE_PROVIDER_SITE_OTHER): Payer: Medicaid Other | Admitting: Family Medicine

## 2020-12-11 ENCOUNTER — Other Ambulatory Visit: Payer: Self-pay

## 2020-12-11 VITALS — BP 150/86 | HR 77 | Temp 97.5°F

## 2020-12-11 DIAGNOSIS — I10 Essential (primary) hypertension: Secondary | ICD-10-CM

## 2020-12-11 MED ORDER — LOSARTAN POTASSIUM 50 MG PO TABS: 75.0000 mg | ORAL_TABLET | Freq: Every day | ORAL | 0 refills | Status: DC

## 2020-12-11 MED ORDER — LOSARTAN POTASSIUM 50 MG PO TABS: 75.0000 mg | ORAL_TABLET | Freq: Every day | ORAL | 3 refills | Status: DC

## 2020-12-11 NOTE — Progress Notes (Signed)
Medical screening examination/treatment was performed by qualified clinical staff member and as supervising provider I was immediately available for consultation/collaboration. I have reviewed documentation and agree with assessment and plan. ? ?Keiaira Donlan B Jackline Castilla, NP ? ?

## 2020-12-11 NOTE — Progress Notes (Signed)
Patient presents today as a nurse visit for a blood pressure check.  Patient states she is taking her medication as prescribed without any side effects/adverse effects. Medication and allergy list reviewed with patient and the pharmacy has been verified.   HA: No Dizziness/lightheadedness: No Fever: No BA: No Weakness/Fatigue: No  Sinus pain/pressure: No  Runny nose: No  ST: No  ShOB: No  CP: No  Palps: No Abd pain: No Dysuria: No  N/V/C/D: No    Vital Signs at 3:51 PM Blood Pressure: 155/84 Pulse: 81 SpO2: 98%  Vital Signs at 4:01 PM Blood Pressure: 150/86 Pulse: 77 SpO2: 98%   Information shared with Hyman Hopes, DNP, FNP-C who instructed me to tell pt to increase losartan to 75 mg daily (1.5 tablets), monitor BP at home and keep a log, and to schedule a f/u NV in 2 weeks for a BP recheck and to also have lab work done.   Pt aware and verbalized understanding. Pt escorted to check out for scheduling.

## 2020-12-11 NOTE — Patient Instructions (Addendum)
Increase losartan to 75 mg daily (1.5 tablets daily). A new prescription has been sent to the pharmacy for you. Monitor your blood pressure at home and keep a log.  Schedule a follow up nurse visit in 2 weeks for a blood pressure recheck.  You will also need to have lab work at that nurse visit.

## 2020-12-23 ENCOUNTER — Other Ambulatory Visit: Payer: Self-pay | Admitting: Family Medicine

## 2020-12-23 DIAGNOSIS — M7522 Bicipital tendinitis, left shoulder: Secondary | ICD-10-CM

## 2020-12-25 ENCOUNTER — Ambulatory Visit: Payer: Medicaid Other | Admitting: Sports Medicine

## 2020-12-25 ENCOUNTER — Other Ambulatory Visit: Payer: Self-pay

## 2020-12-25 ENCOUNTER — Ambulatory Visit: Payer: Medicaid Other

## 2020-12-25 DIAGNOSIS — M7522 Bicipital tendinitis, left shoulder: Secondary | ICD-10-CM | POA: Diagnosis not present

## 2020-12-25 DIAGNOSIS — I10 Essential (primary) hypertension: Secondary | ICD-10-CM | POA: Diagnosis not present

## 2020-12-25 LAB — BASIC METABOLIC PANEL
BUN: 17 mg/dL (ref 7–25)
CO2: 31 mmol/L (ref 20–32)
Calcium: 9.6 mg/dL (ref 8.6–10.4)
Chloride: 102 mmol/L (ref 98–110)
Creat: 0.78 mg/dL (ref 0.60–1.00)
Glucose, Bld: 93 mg/dL (ref 65–99)
Potassium: 3.5 mmol/L (ref 3.5–5.3)
Sodium: 142 mmol/L (ref 135–146)

## 2020-12-25 NOTE — Progress Notes (Signed)
    Procedures performed today:    None.  Independent interpretation of notes and tests performed by another provider:   None.  Brief History, Exam, Impression, and Recommendations:    Biceps tendinitis, left, distal Pleasant 70 year old female, over a month of pain left elbow, anterolateral, worse with holding grandchild. On exam there is no tenderness at the common extensor tendon origin, moderate pain at the biceps insertion, positive speeds and Yergason test localizing to the distal biceps. Has tried NSAIDs, some home exercises without much improvement. Adding formal physical therapy, I have also suggested the baby support belt, this will be life-changing. If insufficient improvement at a 4-week follow-up and if she has gotten the baby support belt then we will consider a distal biceps injection.  Essential hypertension, benign Needs to establish with another provider for treatment. Looks like there is an appointment in April with Joy but she needs to get in within the next couple of weeks with someone else here.    ___________________________________________ Ihor Austin. Benjamin Stain, M.D., ABFM., CAQSM. Primary Care and Sports Medicine [facility]  Adjunct Instructor of Family Medicine  [employer]

## 2020-12-25 NOTE — Assessment & Plan Note (Signed)
Pleasant 70 year old female, over a month of pain left elbow, anterolateral, worse with holding grandchild. On exam there is no tenderness at the common extensor tendon origin, moderate pain at the biceps insertion, positive speeds and Yergason test localizing to the distal biceps. Has tried NSAIDs, some home exercises without much improvement. Adding formal physical therapy, I have also suggested the baby support belt, this will be life-changing. If insufficient improvement at a 4-week follow-up and if she has gotten the baby support belt then we will consider a distal biceps injection.

## 2020-12-25 NOTE — Addendum Note (Signed)
Addended by: Monica Becton on: 12/25/2020 09:26 AM   Modules accepted: Orders

## 2020-12-25 NOTE — Assessment & Plan Note (Signed)
Needs to establish with another provider for treatment. Looks like there is an appointment in April with Joy but she needs to get in within the next couple of weeks with someone else here.

## 2021-01-01 ENCOUNTER — Encounter: Payer: Self-pay | Admitting: Emergency Medicine

## 2021-01-01 ENCOUNTER — Other Ambulatory Visit: Payer: Self-pay

## 2021-01-01 ENCOUNTER — Emergency Department
Admission: EM | Admit: 2021-01-01 | Discharge: 2021-01-01 | Disposition: A | Discharge status: Home / Self Care | Payer: Medicaid Other | Source: Home / Self Care

## 2021-01-01 DIAGNOSIS — J069 Acute upper respiratory infection, unspecified: Secondary | ICD-10-CM | POA: Diagnosis not present

## 2021-01-01 LAB — POC SARS CORONAVIRUS 2 AG -  ED: SARS Coronavirus 2 Ag: NEGATIVE

## 2021-01-01 LAB — POCT INFLUENZA A/B
Influenza A, POC: NEGATIVE
Influenza B, POC: NEGATIVE

## 2021-01-01 MED ORDER — OSELTAMIVIR PHOSPHATE 30 MG PO CAPS: 30.0000 mg | ORAL_CAPSULE | Freq: Two times a day (BID) | ORAL | 0 refills | Status: AC

## 2021-01-01 MED ORDER — BENZONATATE 100 MG PO CAPS: 100.0000 mg | ORAL_CAPSULE | Freq: Three times a day (TID) | ORAL | 0 refills | Status: DC

## 2021-01-01 NOTE — Discharge Instructions (Signed)
Flu test negative, but given symptoms and known close exposure take Tamiflu as prescribed. Take Tessalon as prescribed as needed to help with cough. Rest and keep hydrated and continue other OTC medicine. Follow-up with PCP if no improvement in a week. Go to the ER if develop difficulty breathing.

## 2021-01-01 NOTE — ED Provider Notes (Addendum)
[facility]    CSN: [id number] Arrival date & time: 01/01/21  1112      History   Chief Complaint Chief Complaint  Patient presents with   Cough    HPI Katrina Baird is a 70 y.o. female.   Patient presents with her granddaughter with concerns of feeling unwell the past few days. She has had headache, body aches, fatigue, congestion, sore throat, and cough. She denies known fever. The patient has been taking Tylenol with minimal improvement. She does report close contact with flu.   The history is provided by the patient and a relative. A language interpreter was used (pt's granddaughter).  Cough Associated symptoms: headaches, myalgias and sore throat   Associated symptoms: no chest pain, no ear pain, no fever, no rash, no rhinorrhea and no shortness of breath    Past Medical History:  Diagnosis Date   Hyperlipidemia with target LDL less than 130 09/23/2012   Hypertension    Osteoporosis 02/03/2015   Vertigo     Patient Active Problem List   Diagnosis Date Noted   Biceps tendinitis, left, distal 12/25/2020   Glucose intolerance (impaired glucose tolerance) 06/20/2020   Osteoporosis 02/03/2015   Axillary lump 01/31/2015   Colon cancer screening 03/30/2014   Visit for screening mammogram 03/08/2013   Insomnia 03/08/2013   Thoracic back pain 09/23/2012   Essential hypertension, benign 09/23/2012   Hyperlipidemia with target LDL less than 130 09/23/2012    History reviewed. No pertinent surgical history.  OB History   No obstetric history on file.      Home Medications    Prior to Admission medications   Medication Sig Start Date End Date Taking? Authorizing Provider  alendronate (FOSAMAX) 10 MG tablet Take 1 tablet (10 mg total) by mouth daily before breakfast. Take with a full glass of water on an empty stomach. 06/20/20  Yes Sunnie NielsenAlexander, Natalie, DO  atorvastatin (LIPITOR) 80 MG tablet Take 1 tablet (80 mg total) by mouth daily. 06/20/20  Yes Sunnie NielsenAlexander,  Natalie, DO  benzonatate (TESSALON) 100 MG capsule Take 1 capsule (100 mg total) by mouth every 8 (eight) hours. 01/01/21  Yes Conny Situ L, PA  hydrochlorothiazide (HYDRODIURIL) 12.5 MG tablet Take 1 tablet (12.5 mg total) by mouth daily. 06/20/20  Yes Sunnie NielsenAlexander, Natalie, DO  losartan (COZAAR) 50 MG tablet Take 1.5 tablets (75 mg total) by mouth daily. 12/11/20  Yes Clayborne DanaBeck, Taylor B, NP  meloxicam (MOBIC) 15 MG tablet TAKE 1 TABLET (15 MG TOTAL) BY MOUTH DAILY. 12/25/20  Yes Clayborne DanaBeck, Taylor B, NP  ondansetron (ZOFRAN ODT) 8 MG disintegrating tablet Take 1 tablet (8 mg total) by mouth every 8 (eight) hours as needed for nausea or vomiting. Or motion sickness (prior to long car rides) 07/25/20  Yes Sunnie NielsenAlexander, Natalie, DO  oseltamivir (TAMIFLU) 30 MG capsule Take 1 capsule (30 mg total) by mouth 2 (two) times daily for 5 days. 01/01/21 01/06/21 Yes Giannina Bartolome L, PA  potassium chloride (KLOR-CON) 10 MEQ tablet Take 2 tablets (20 mEq total) by mouth daily. 06/21/20  Yes Sunnie NielsenAlexander, Natalie, DO    Family History Family History  Problem Relation Age of Onset   Cancer Neg Hx    Diabetes Neg Hx    Early death Neg Hx    Heart disease Neg Hx    Hyperlipidemia Neg Hx    Hypertension Neg Hx    Kidney disease Neg Hx    Stroke Neg Hx     Social History Social History  Tobacco Use   Smoking status: Never   Smokeless tobacco: Never  Substance Use Topics   Alcohol use: No   Drug use: No     Allergies   Patient has no known allergies.   Review of Systems Review of Systems  Constitutional:  Positive for fatigue. Negative for fever.  HENT:  Positive for congestion and sore throat. Negative for ear pain and rhinorrhea.   Eyes:  Negative for redness.  Respiratory:  Positive for cough. Negative for shortness of breath.   Cardiovascular:  Negative for chest pain.  Gastrointestinal:  Negative for diarrhea, nausea and vomiting.  Musculoskeletal:  Positive for myalgias.  Skin:  Negative for rash.   Neurological:  Positive for headaches. Negative for dizziness.    Physical Exam Triage Vital Signs ED Triage Vitals [01/01/21 1131]  Enc Vitals Group     BP (!) 142/91     Pulse Rate 87     Resp      Temp 98.4 F (36.9 C)     Temp Source Oral     SpO2 96 %     Weight 150 lb (68 kg)     Height 5\' 5"  (1.651 m)     Head Circumference      Peak Flow      Pain Score 0     Pain Loc      Pain Edu?      Excl. in GC?    No data found.  Updated Vital Signs BP (!) 142/91 (BP Location: Left Arm)    Pulse 87    Temp 98.4 F (36.9 C) (Oral)    Ht 5\' 5"  (1.651 m)    Wt 150 lb (68 kg)    SpO2 96%    BMI 24.96 kg/m   Visual Acuity Right Eye Distance:   Left Eye Distance:   Bilateral Distance:    Right Eye Near:   Left Eye Near:    Bilateral Near:     Physical Exam Vitals and nursing note reviewed.  Constitutional:      General: She is not in acute distress. HENT:     Head: Normocephalic.     Nose: Congestion present. No rhinorrhea.     Mouth/Throat:     Mouth: Mucous membranes are moist.     Pharynx: Oropharynx is clear.  Eyes:     Conjunctiva/sclera: Conjunctivae normal.     Pupils: Pupils are equal, round, and reactive to light.  Cardiovascular:     Rate and Rhythm: Normal rate and regular rhythm.     Heart sounds: Normal heart sounds.  Pulmonary:     Effort: Pulmonary effort is normal.     Breath sounds: Normal breath sounds.  Musculoskeletal:     Cervical back: Normal range of motion.  Lymphadenopathy:     Cervical: No cervical adenopathy.  Skin:    Findings: No rash.  Neurological:     Mental Status: She is alert.     Gait: Gait normal.  Psychiatric:        Mood and Affect: Mood normal.     UC Treatments / Results  Labs (all labs ordered are listed, but only abnormal results are displayed) Labs Reviewed  POC SARS CORONAVIRUS 2 AG -  ED  POCT INFLUENZA A/B    EKG   Radiology No results found.  Procedures Procedures (including critical care  time)  Medications Ordered in UC Medications - No data to display  Initial Impression / Assessment and Plan /  UC Course  I have reviewed the triage vital signs and the nursing notes.  Pertinent labs & imaging results that were available during my care of the patient were reviewed by me and considered in my medical decision making (see chart for details).     Flu and covid tests negative. Given sx, high risk due to age, and known close contact with flu discussed Tamiflu to cover for false negative flu test / prophylaxis and they would like to take. Tamiflu dosage adjusted due to reduced GFR. Sx tx and reassurance. Return and ER precautions discussed.   E/M: 1 acute uncomplicated illness, 2 data (flu, covid), moderate risk due to prescription management  Final Clinical Impressions(s) / UC Diagnoses   Final diagnoses:  Viral upper respiratory tract infection     Discharge Instructions      Flu test negative, but given symptoms and known close exposure take Tamiflu as prescribed. Take Tessalon as prescribed as needed to help with cough. Rest and keep hydrated and continue other OTC medicine. Follow-up with PCP if no improvement in a week. Go to the ER if develop difficulty breathing.     ED Prescriptions     Medication Sig Dispense Auth. Provider   benzonatate (TESSALON) 100 MG capsule Take 1 capsule (100 mg total) by mouth every 8 (eight) hours. 21 capsule Abner Greenspan, Jabarie Pop L, PA   oseltamivir (TAMIFLU) 30 MG capsule Take 1 capsule (30 mg total) by mouth 2 (two) times daily for 5 days. 10 capsule Abner Greenspan, Vetta Couzens L, PA      PDMP not reviewed this encounter.   Delsa Sale, PA 01/01/21 1236    Delsa Sale, [place] 01/01/21 1236

## 2021-01-01 NOTE — ED Triage Notes (Signed)
Patient's granddaughter c/o fatigue, cough, body chills and congestion x 3 days.  Patient has been taken Tylenol.  Patient is vaccinated for COVID.

## 2021-01-02 DIAGNOSIS — M25512 Pain in left shoulder: Secondary | ICD-10-CM | POA: Diagnosis not present

## 2021-01-02 DIAGNOSIS — M24512 Contracture, left shoulder: Secondary | ICD-10-CM | POA: Diagnosis not present

## 2021-01-02 DIAGNOSIS — M542 Cervicalgia: Secondary | ICD-10-CM | POA: Diagnosis not present

## 2021-01-02 DIAGNOSIS — M6281 Muscle weakness (generalized): Secondary | ICD-10-CM | POA: Diagnosis not present

## 2021-01-09 DIAGNOSIS — M24512 Contracture, left shoulder: Secondary | ICD-10-CM | POA: Diagnosis not present

## 2021-01-09 DIAGNOSIS — M6281 Muscle weakness (generalized): Secondary | ICD-10-CM | POA: Diagnosis not present

## 2021-01-09 DIAGNOSIS — M542 Cervicalgia: Secondary | ICD-10-CM | POA: Diagnosis not present

## 2021-01-09 DIAGNOSIS — M25512 Pain in left shoulder: Secondary | ICD-10-CM | POA: Diagnosis not present

## 2021-01-17 DIAGNOSIS — M542 Cervicalgia: Secondary | ICD-10-CM | POA: Diagnosis not present

## 2021-01-17 DIAGNOSIS — M24512 Contracture, left shoulder: Secondary | ICD-10-CM | POA: Diagnosis not present

## 2021-01-17 DIAGNOSIS — M25512 Pain in left shoulder: Secondary | ICD-10-CM | POA: Diagnosis not present

## 2021-01-17 DIAGNOSIS — M6281 Muscle weakness (generalized): Secondary | ICD-10-CM | POA: Diagnosis not present

## 2021-01-22 ENCOUNTER — Other Ambulatory Visit: Payer: Self-pay

## 2021-01-22 ENCOUNTER — Ambulatory Visit: Payer: Medicaid Other | Admitting: Sports Medicine

## 2021-01-22 DIAGNOSIS — M7522 Bicipital tendinitis, left shoulder: Secondary | ICD-10-CM | POA: Diagnosis not present

## 2021-01-22 NOTE — Progress Notes (Signed)
° ° °  Procedures performed today:    None.  Independent interpretation of notes and tests performed by another provider:   None.  Brief History, Exam, Impression, and Recommendations:    Biceps tendinitis, left, distal This is a very pleasant 71 year old female, she had a long history of left elbow pain, anterolateral, typically precipitated by holding her grandchild. She had signs consistent with distal biceps tendinitis at the last visit, we treated her conservatively with using a baby support belt, home conditioning exercises, she returns today as symptoms have resolved, return as needed.  I again advised her to follow-up with a PCP here for her blood pressure and for a new complaint of vertigo on head movement.    ___________________________________________ Gwen Her. Dianah Field, M.D., ABFM., CAQSM. Primary Care and [place] Instructor of [employer] [facility]

## 2021-01-22 NOTE — Assessment & Plan Note (Addendum)
This is a very pleasant 71 year old female, she had a long history of left elbow pain, anterolateral, typically precipitated by holding her grandchild. She had signs consistent with distal biceps tendinitis at the last visit, we treated her conservatively with using a baby support belt, home conditioning exercises, she returns today as symptoms have resolved, return as needed.  I again advised her to follow-up with a PCP here for her blood pressure and for a new complaint of vertigo on head movement.

## 2021-01-23 DIAGNOSIS — M6281 Muscle weakness (generalized): Secondary | ICD-10-CM | POA: Diagnosis not present

## 2021-01-23 DIAGNOSIS — M24512 Contracture, left shoulder: Secondary | ICD-10-CM | POA: Diagnosis not present

## 2021-01-23 DIAGNOSIS — M542 Cervicalgia: Secondary | ICD-10-CM | POA: Diagnosis not present

## 2021-01-23 DIAGNOSIS — M25512 Pain in left shoulder: Secondary | ICD-10-CM | POA: Diagnosis not present

## 2021-01-25 ENCOUNTER — Other Ambulatory Visit: Payer: Self-pay | Admitting: Family Medicine

## 2021-01-25 DIAGNOSIS — M7522 Bicipital tendinitis, left shoulder: Secondary | ICD-10-CM

## 2021-01-30 DIAGNOSIS — M542 Cervicalgia: Secondary | ICD-10-CM | POA: Diagnosis not present

## 2021-01-30 DIAGNOSIS — M24512 Contracture, left shoulder: Secondary | ICD-10-CM | POA: Diagnosis not present

## 2021-01-30 DIAGNOSIS — M25512 Pain in left shoulder: Secondary | ICD-10-CM | POA: Diagnosis not present

## 2021-01-30 DIAGNOSIS — M6281 Muscle weakness (generalized): Secondary | ICD-10-CM | POA: Diagnosis not present

## 2021-02-05 ENCOUNTER — Other Ambulatory Visit: Payer: Self-pay

## 2021-02-05 ENCOUNTER — Encounter: Payer: Self-pay | Admitting: Family Medicine

## 2021-02-05 ENCOUNTER — Ambulatory Visit: Payer: Medicaid Other | Admitting: Family Medicine

## 2021-02-05 VITALS — BP 134/84 | HR 79 | Temp 97.9°F | Ht 65.0 in | Wt 149.0 lb

## 2021-02-05 DIAGNOSIS — I10 Essential (primary) hypertension: Secondary | ICD-10-CM | POA: Diagnosis not present

## 2021-02-05 DIAGNOSIS — E785 Hyperlipidemia, unspecified: Secondary | ICD-10-CM

## 2021-02-05 DIAGNOSIS — R7302 Impaired glucose tolerance (oral): Secondary | ICD-10-CM

## 2021-02-05 LAB — POCT GLYCOSYLATED HEMOGLOBIN (HGB A1C): Hemoglobin A1C: 5.9 % — AB (ref 4.0–5.6)

## 2021-02-05 MED ORDER — LOSARTAN POTASSIUM 100 MG PO TABS: 100.0000 mg | ORAL_TABLET | Freq: Every day | ORAL | 1 refills | Status: DC

## 2021-02-05 NOTE — Assessment & Plan Note (Signed)
Blood sugars remain well controlled with A1c of 5.9%.

## 2021-02-05 NOTE — Assessment & Plan Note (Signed)
She has had elevated blood pressure readings at home and initial reading in the clinic was elevated today, but improved on second reading.  We will have her go ahead and increase losartan to 100 mg daily.  Continue hydrochlorothiazide at current strength.

## 2021-02-05 NOTE — Progress Notes (Signed)
Katrina Baird - 71 y.o. female MRN [id number]  Date of birth: Oct 25, 1950  Subjective Chief Complaint  Patient presents with   Hypertension   Dizziness    HPI Katrina Baird is a 71 year old female here today for follow-up visit.  Interpreter was offered however declined.  She preferred that her granddaughter interpret for her.  Reports overall she is doing well.  She has had some episodes of dizziness that she feels may be related to her blood pressure.  When she feels dizzy readings have been elevated at home.  She is taking medications as directed.  She denies any other symptoms related to this including chest pain, shortness of breath, palpitations, headaches or vision changes.  Blood sugars have remained pretty well controlled.  A1c at 5.9% today.  She is not on any medications for management of blood sugar.  ROS:  A comprehensive ROS was completed and negative except as noted per HPI  No Known Allergies  Past Medical History:  Diagnosis Date   Hyperlipidemia with target LDL less than 130 09/23/2012   Hypertension    Osteoporosis 02/03/2015   Vertigo     No past surgical history on file.  Social History   Socioeconomic History   Marital status: Married    Spouse name: Not on file   Number of children: Not on file   Years of education: Not on file   Highest education level: Not on file  Occupational History   Not on file  Tobacco Use   Smoking status: Never   Smokeless tobacco: Never  Substance and Sexual Activity   Alcohol use: No   Drug use: No   Sexual activity: Not Currently  Other Topics Concern   Not on file  Social History Narrative   Not on file   Social Determinants of Health   Financial Resource Strain: Not on file  Food Insecurity: Not on file  Transportation Needs: Not on file  Physical Activity: Not on file  Stress: Not on file  Social Connections: Not on file    Family History  Problem Relation Age of Onset   Cancer Neg Hx    Diabetes Neg Hx     Early death Neg Hx    Heart disease Neg Hx    Hyperlipidemia Neg Hx    Hypertension Neg Hx    Kidney disease Neg Hx    Stroke Neg Hx     Health Maintenance  Topic Date Due   Zoster Vaccines- Shingrix (1 of 2) 02/27/2021 (Originally 02/02/1998)   COLONOSCOPY (Pts 45-29yrs Insurance coverage will need to be confirmed)  12/11/2021 (Originally 02/02/1993)   MAMMOGRAM  04/06/2021   TETANUS/TDAP  11/03/2022   Pneumonia Vaccine 79+ Years old  Completed   INFLUENZA VACCINE  Completed   DEXA SCAN  Completed   COVID-19 Vaccine  Completed   Hepatitis C Screening  Completed   HPV VACCINES  Aged Out     ----------------------------------------------------------------------------------------------------------------------------------------------------------------------------------------------------------------- Physical Exam BP 134/84 (BP Location: Left Arm, Patient Position: Sitting, Cuff Size: Normal)    Pulse 79    Temp 97.9 F (36.6 C)    Ht 5\' 5"  (1.651 m)    Wt 149 lb (67.6 kg)    SpO2 97%    BMI 24.79 kg/m   Physical Exam Constitutional:      Appearance: Normal appearance.  Eyes:     General: No scleral icterus. Cardiovascular:     Rate and Rhythm: Normal rate and regular rhythm.  Pulmonary:  Effort: Pulmonary effort is normal.     Breath sounds: Normal breath sounds.  Musculoskeletal:     Cervical back: Neck supple.  Neurological:     General: No focal deficit present.     Mental Status: She is alert.  Psychiatric:        Mood and Affect: Mood normal.        Behavior: Behavior normal.    ------------------------------------------------------------------------------------------------------------------------------------------------------------------------------------------------------------------- Assessment and Plan  Essential hypertension, benign She has had elevated blood pressure readings at home and initial reading in the clinic was elevated today, but improved on  second reading.  We will have her go ahead and increase losartan to 100 mg daily.  Continue hydrochlorothiazide at current strength.  Glucose intolerance (impaired glucose tolerance) Blood sugars remain well controlled with A1c of 5.9%.  Hyperlipidemia with target LDL less than 130 Tolerating atorvastatin well.  Recommend continuation of current strength.   Meds ordered this encounter  Medications   losartan (COZAAR) 100 MG tablet    Sig: Take 1 tablet (100 mg total) by mouth daily.    Dispense:  90 tablet    Refill:  1    Return in about 4 months (around 06/05/2021) for HTN.    This visit occurred during the SARS-CoV-2 public health emergency.  Safety protocols were in place, including screening questions prior to the visit, additional usage of staff PPE, and extensive cleaning of exam room while observing appropriate contact time as indicated for disinfecting solutions.  's

## 2021-02-05 NOTE — Assessment & Plan Note (Signed)
Tolerating atorvastatin well.  Recommend continuation of current strength. 

## 2021-02-05 NOTE — Patient Instructions (Addendum)
Increase losartan to 100mg  daily See me again in 4 months.    Qu?n l t?ng huy?t p Managing Your Hypertension T?ng huy?t p, cn ???c g?i l huy?t p cao, l khi l?c b?m mu p vo thnh ??ng m?ch qu m?nh. Cc ??ng m?ch l cc m?ch mu mang mu t? tim ?i kh?p c? th? c?a qu v?. T?ng huy?t p bu?c tim ph?i lm vi?c nhi?u h?n ?? b?m mu v c th? khi?n cho cc ??ng m?ch b? h?p ho?c c?ng. Hi?u bi?t v? cc ch? s? huy?t p Huy?t p m?c tiu c nhn c?a qu v? c th? khc nhau ty thu?c v tnh tr?ng b?nh l, tu?i v cc y?u t? khc. Ch? s? huy?t p g?m m?t ch? s? cao trn m?t ch? s? th?p. L t??ng l huy?t a?p cu?a quy? vi? c?n ph?i d??i 120/80. Qu v? nn bi?t r?ng: Ch? s? ??u tin, hay ch? s? cao nh?t, ???c g?i l huy?t p tm thu. ?y l s? ?o p l?c trong ??ng m?ch khi tim qu v? ??p. Ch? s? th? hai, hay ch? s? th?p nh?t, ???c g?i l huy?t p tm tr??ng. ?y l s? ?o p l?c trong ??ng m?ch khi tim qu v? ngh?. Huy?t p ???c phn lo?i thnh b?n giai ?o?n. D?a trn ch? s? huy?t p c?a qu v?, chuyn gia ch?m [place] s?c kh?e c?a qu v? c th? s? d?ng nh?ng giai ?o?n sau ?y ?? xc ??nh lo?i ?i?u tr? qu v? c?n, n?u c. Huy?t p tm thu v huy?t p tm tr??ng ???c ?o theo ??n v? mmHg. Bnh th??ng Huy?t p tm thu: d??i 120. Huy?t p tm tr??ng: d??i 80. Cao Huy?t p tm thu: 120-129. Huy?t p tm tr??ng: d??i 80. T?ng huy?t p giai ?o?n 1 Huy?t p tm thu: 130-139. Huy?t p tm tr??ng: 80-89. T?ng huy?t p giai ?o?n 2 Huy?t p tm thu: 140 tr? ln. Huy?t p tm tr??ng: t? 90 tr? ln. Tnh tr?ng ny c th? ?nh h??ng ??n ti nh? th? no? Qu?n l t?ng huy?t p c?a qu v? l m?t trch nhi?m quan tr?ng. Theo th?i gian, t?ng huy?t p c th? gy t?n th??ng cc ??ng m?ch v lm gi?m l?u l??ng mu ??n cc b? ph?n quan tr?ng c?a c? th?, bao g?m c? no, tim v th?n. T?ng huy?t p khng ???c ki?m sot ho?c ?i?u tr? c th? d?n t?i: Nh?i mu c? tim. ??t qu? M?ch mu b? y?u (ch?ng phnh m?ch). Suy  tim. T?n th??ng th?n. T?n th??ng m?t. H?i ch?ng chuy?n ha. Cc v?n ?? v? t?p trung v tr nh?. Sa st tr tu? do m?ch mu. Ti c th? th??c hi?n nh?ng hnh ??ng no ?? qu?n l tnh tr?ng ny? C th? qu?n l t?ng huy?t p b?ng cch thay ??i l?i s?ng v c th? l b?ng cch dng thu?c. Chuyn gia ch?m [place] s?c kh?e c?a qu v? s? gip qu v? ln k? ho?ch ??a huy?t p v? gi?i h?n bnh th??ng. Dinh d??ng  ?n ch? ?? giu ch?t x? v kali v t mu?i (natri), t ???ng b? sung v t ch?t bo. M?t k? ho?ch ?n m?u c tn l ch? ?? ?n Cc ph??ng php ti?p c?n ch? ?? ?n u?ng ?? ng?n ch?n t?ng huy?t p (DASH). ?n theo cch ny: ?n nhi?u tri cy v rau c? t??i. Vo m?i b?a ?n, c? g?ng dnh m?t n?a ??a cho tri cy v rau c?. ?n ng? c?c nguyn cm, ch?ng h?n  nh? m ?ng lm t? b?t m nguyn cm, g?o l?t, ho?c bnh m t? b?t m nguyn cm. Cho ngu? c?c nguyn ca?m va?o kho?ng m?t ph?n t? ??a. ?n cc s?n ph?m s?a t bo. Trnh nh?ng mi?ng th?t nhi?u m?, th?t ch? bi?n s?n ho?c th?t ??p mu?i v th?t gia c?m c da. Dnh kho?ng m?t ph?n t? ??a c?a qu v? cho cc protein n?c, ch?ng h?n nh? c, th?t g khng da, ??u, tr?ng v ??u ph?. Trnh nh?ng th?c ph?m ch? bi?n s?n ho?c lm s?n. Nh?ng th?c ph?m ny th??ng c nhi?u natri, b? sung ???ng v ch?t bo h?n. Gi?m l??ng dng natri hng ngy c?a qu v?. H?u h?t nh?ng ng??i b? t?ng huy?t p ??u nn ?n d??i 1.500 mg natri m?i ngy. L?i s?ng  H?p tc v?i chuyn gia ch?m [place] s?c kh?e c?a qu v? ?? duy tr tr?ng l??ng c? th? c l?i cho s?c kh?e ho?c ?? gi?m cn. Hy h?i xem tr?ng l??ng no l l t??ng cho qu v?. Dnh t nh?t 30 pht t?p th? d?c c th? khi?n tim qu v? ??p nhanh h?n (t?p th? d?c nh?p ?i?u) h?u h?t cc ngy trong tu?n. Cc ho?t ??ng c th? bao g?m ?i b?, b?i, ho?c ??p xe. ??a vo bi t?p t?ng s?c m?nh c? b?p (bi t?p khng l?c), ch?ng h?n nh? nng t?, trong khun kh? thi quen luy?n t?p hng tu?n c?a qu v?. C? g?ng t?p nh?ng lo?i bi t?p ny trong vng 30 pht,  t nh?t l 3 ngy m?i tu?n. Khng s? d?ng b?t k? s?n ph?m no c nicotine ho?c thu?c l, ch?ng ha?n nh? thu?c l d?ng ht, thu?c l ?i?n t? v thu?c l d?ng nhai. N?u qu v? c?n gip ?? ?? cai thu?c, hy h?i chuyn gia ch?m [place] s?c kh?e. Ki?m sot b?t k? tnh tr?ng ko di (m?n tnh) no m qu v? c, ch?ng h?n nh? cholesterol cao ho?c ti?u ???ng. Xc ??nh cc ngu?n gy c?ng th?ng v tm cch qu?n l c?ng th?ng. Vi?c ny c th? bao g?m thi?n ??nh, th? su ho?c dnh th?i gian cho cc ho?t ??ng vui v?. S? d?ng r??u Khng u?ng r??u n?u: Chuyn gia ch?m [place] s?c kh?e khuyn qu v? khng u?ng r??u. Qu v? c [place], c th? c [place], ho?c c k? ho?ch c [place]. N?u qu v? u?ng r??u: Gi?i h?n l??ng r??u qu v? u?ng ? m?c: 0-1 [place]/ngy ??i v?i n? gi?i. 0-2 [place]/ngy ??i v?i nam gi?i. Bi?t r m?t [place] c bao nhiu r??u. ? M?, m?t Miklos t??ng ???ng v?i m?t chai bia 12 ao x? (355 mL), m?t [place] r??u vang 5 ao x? (148 mL), ho?c m?t [place] r??u m?nh 1 ao x? (44 mL). Thu?c Chuyn gia ch?m [place] s?c kh?e c th? k ??n thu?c n?u thay ??i l?i s?ng khng ?? ?? ??a huy?t p v? m?c c th? ki?m sot ???c v n?u: Huy?t p tm thu c?a qu v? t? 130 tr? ln. Huy?t p tm tr??ng c?a qu v? t? 80 tr? ln. Ch? s? d?ng thu?c theo h??ng d?n c?a chuyn gia ch?m [place] s?c kh?e cu?a quy? vi?. Lm theo ch? d?n m?t cch c?n th?n. Thu?c ?i?u tr? huy?t p ph?i ???c dng theo ch? d?n c?a chuyn gia ch?m  s?c kh?e. Thu?c c?ng s? khng c tc d?ng khi qu v? b? li?u. Vi?c b? li?u thu?c c?ng lm qu v? c nguy c? pht sinh v?n ??Danice Goltz di Tr??c khi  qu v? theo di huy?t p: Khng ht thu?c, u?ng ?? u?ng ch?a caffein, ho?c t?p th? d?c trong vng 30 pht tr??c khi ?o. ?i v? sinh v ?i ti?u (ti?u ti?n). Ng?i yn t?nh trong t nh?t 5 pht tr??c khi ?o. Theo di huy?t p c?a qu v? t?i nh theo h??ng d?n c?a chuyn gia ch?m [place] s?c kh?e. ?? lm ?i?u ny: Ng?i th?ng v c t?a l?ng. ?? hai bn chn b?ng ph?ng trn sn nh. Khng b?t cho chn. Ch?ng cnh  tay trn m?t b? m?t ph?ng, ch?ng h?n nh? bn. ??m b?o r?ng ph?n cnh tay trn c?a qu v? ngang b?ng tim. M?i l?n qu v? ?o, hy l?y hai ho?c ba ch? s? cch nhau m?t pht v ghi l?i cc k?t qu? ?. Qu v? c?ng c th? c?n c chuyn gia ch?m [place] s?c kho? ki?m tra huy?t p c?a qu v? th??ng xuyn. Thng tin chung [place] ??i v?i chuyn gia ch?m [place] s?c kh?e c?a qu v? v? ch? ?? ?n, thi qun luy?n t?p v cc y?u t? l?i s?ng khc c th? gp ph?n lm t?ng huy?t p. Cng v?i chuyn gia ch?m [place] s?c kh?e xem xt l?i t?t c? cc lo?i thu?c qu v? dng b?i v c th? c tc d?ng ph? ho?c t??ng tc thu?c. Tun th? t?t c? cc l?n khm theo ch? d?n c?a chuyn gia ch?m [place] s?c kh?e. Chuyn gia ch?m [place] s?c kh?e c?a qu v? c th? gip qu v? t?o ra v ?i?u ch?nh k? ho?ch qu?n l huy?t p cao. N?i tm ki?m thm thng tin National Heart, Lung, and Blood Institute (Vi?n Tim, Ph?i v Mu Qu?c gia): PopSteam.iswww.nhlbi.nih.gov American Heart Association (Hi?p h?i Tim m?ch [place]?): www.heart.org Hy lin l?c v?i chuyn gia ch?m [place] s?c kh?e n?u: Qu v? ngh? l qu v? c ph?n ?ng v?i cc lo?i thu?c ? dng. Qu v? b? ?au ??u l?p ?i l?p l?i (ti pht). Qu v? c?m th?y chng m?t. Qu v? b? s?ng ? c? chn. Qu v? c v?n ?? v? th? l?c. Yu c?u tr? gip ngay l?p t?c n?u: Qu v? b? ?au ??u r?t nhi?u ho?c l l?n. Qu v? b? y?u b?t th??ng ho?c t b, ho?c c?m th?y b? ng?t. Qu v? b? ?au r?t nhi?u ? ng?c ho?c b?ng. Qu v? nn nhi?u l?n. Qu v? b? kh th?. Nh?ng tri?u ch?ng ny c th? l bi?u hi?n c?a m?t v?n ?? nghim tr?ng c?n c?p c?u. Khng ch? xem tri?u ch?ng c h?t khng. Hy ?i khm ngay l?p t?c. G?i cho d?ch v? c?p c?u t?i ??a ph??ng (911 ? [place] Khng t? li xe ??n b?nh vi?n. Tm t?t T?ng huy?t p l khi l?c b?m mu qua cc ??ng m?ch c?a qu v? qu m?nh. N?u tnh tr?ng ny khng ???c ki?m sot, n c th? khi?n qu v? c nguy c? b? cc bi?n ch?ng nghim tr?ng. Huy?t p m?c tiu c nhn c?a qu v? c th? khc nhau ty thu?c v  tnh tr?ng b?nh l, tu?i v cc y?u t? khc. ??i v?i h?u h?t m?i ng??i, huy?t p bnh th??ng l d??i 120/80. Qu?n l t?ng huy?t p b?ng cch thay ??i l?i s?ng, dng thu?c, ho?c c? hai. Thay ??i l?i s?ng ?? gip qu?n l t?ng huy?t p bao g?m gi?m cn, ?n ch? ?? ?n c l?i cho s?c kh?e, t natri, t?p th? d?c nhi?u h?n, ng?ng ht thu?c v h?n ch? u?ng r??u. Thng tin  ny khng nh?m m?c ?ch thay th? cho l?i khuyn m chuyn gia ch?m [place] s?c kh?e ni v?i qu v?. Hy b?o ??m qu v? ph?i th?o lu?n b?t k? v?n ?? g m qu v? c v?i chuyn gia ch?m [place] s?c kh?e c?a qu v?. Document Revised: 02/05/2019 Document Reviewed: 01/28/2019 Elsevier Patient Education  2022 [employer]

## 2021-02-26 ENCOUNTER — Ambulatory Visit: Payer: Medicaid Other | Admitting: Physician Assistant

## 2021-02-26 ENCOUNTER — Encounter: Payer: Self-pay | Admitting: Physician Assistant

## 2021-02-26 ENCOUNTER — Other Ambulatory Visit: Payer: Self-pay

## 2021-02-26 VITALS — BP 142/86 | HR 98 | Ht 65.0 in | Wt 150.0 lb

## 2021-02-26 DIAGNOSIS — R42 Dizziness and giddiness: Secondary | ICD-10-CM

## 2021-02-26 DIAGNOSIS — Z79899 Other long term (current) drug therapy: Secondary | ICD-10-CM | POA: Diagnosis not present

## 2021-02-26 DIAGNOSIS — R630 Anorexia: Secondary | ICD-10-CM

## 2021-02-26 DIAGNOSIS — I10 Essential (primary) hypertension: Secondary | ICD-10-CM

## 2021-02-26 DIAGNOSIS — R5383 Other fatigue: Secondary | ICD-10-CM

## 2021-02-26 NOTE — Patient Instructions (Addendum)
Get labs.  Work on good sleep routine.  Try melatonin 3-10mg  30 minutes before bedtime for sleep as needed.

## 2021-02-27 ENCOUNTER — Encounter: Payer: Self-pay | Admitting: Physician Assistant

## 2021-02-27 ENCOUNTER — Other Ambulatory Visit: Payer: Self-pay | Admitting: Physician Assistant

## 2021-02-27 DIAGNOSIS — R630 Anorexia: Secondary | ICD-10-CM | POA: Insufficient documentation

## 2021-02-27 DIAGNOSIS — R42 Dizziness and giddiness: Secondary | ICD-10-CM | POA: Insufficient documentation

## 2021-02-27 DIAGNOSIS — R748 Abnormal levels of other serum enzymes: Secondary | ICD-10-CM | POA: Insufficient documentation

## 2021-02-27 DIAGNOSIS — R5383 Other fatigue: Secondary | ICD-10-CM | POA: Insufficient documentation

## 2021-02-27 NOTE — Progress Notes (Signed)
B12 and folate are great.  Vitamin D low normal. Make sure taking at least 2000units a day of vitamin D.  Serum iron looks good.  Potassium and sodium look good.  Liver enzymes are elevated greatly from baseline. When you look at other labs it is likely this is from a virus. I am going to add testing to yesterday bloodwork for a few most common viruses that cause this. This should improve with time.  Add EBV and CMV testing and acute hepatitis panel.  Recheck liver enzymes in 2 weeks to see how trending.

## 2021-02-27 NOTE — Progress Notes (Signed)
° °  Subjective:    Patient ID: Katrina Baird, female    DOB: Oct 04, 1950, 71 y.o.   MRN: [id number]  HPI Pt is a 71 yo female who is accompanied by family member to interpret. Pt is having fatigue, dizziness, and no appetite for the last month. Denies any fever, chills, weight loss, nausea, vomiting, abdominal pain, or stool changes. No medication changes. Denies any recent URI. Denies any urinary issues. She is having some trouble going to sleep. No new life events. Dizziness is when changing position. Not tried anything to make better.  .. Active Ambulatory Problems    Diagnosis Date Noted   Thoracic back pain 09/23/2012   Essential hypertension, benign 09/23/2012   Hyperlipidemia with target LDL less than 130 09/23/2012   Visit for screening mammogram 03/08/2013   Insomnia 03/08/2013   Colon cancer screening 03/30/2014   Axillary lump 01/31/2015   Osteoporosis 02/03/2015   Glucose intolerance (impaired glucose tolerance) 06/20/2020   Biceps tendinitis, left, distal 12/25/2020   Elevated liver enzymes 02/27/2021   Dizziness 02/27/2021   Almost no appetite 02/27/2021   No energy 02/27/2021   Resolved Ambulatory Problems    Diagnosis Date Noted   Anemia 09/23/2012   Other abnormal glucose 09/23/2012   Leukocytosis, unspecified 09/23/2012   Osteoporosis, unspecified 09/23/2012   Routine general medical examination at a health care facility 03/08/2013   Past Medical History:  Diagnosis Date   Hypertension    Vertigo         Review of Systems See HPI.     Objective:   Physical Exam  NSR, no murmurs Normal breath sounds TMs WNL. No sinus tenderness Normal bilateral nares Mouth moist with erythema No cervical adenopathy Normal conjunctiva Soft abdomin with no tenderness or masses palpated Negative dix hallpike Normal orthostatic BP and HR.      Assessment & Plan:  Marland KitchenMarland KitchenSanjuanita was seen today for fatigue and anorexia.  Diagnoses and all orders for this visit:  No  energy -     COMPLETE METABOLIC PANEL WITH GFR -     TSH -     VITAMIN D 25 Hydroxy (Vit-D Deficiency, Fractures) -     B12 and Folate Panel -     CBC with Differential/Platelet -     Fe+TIBC+Fer  Essential hypertension, benign -     COMPLETE METABOLIC PANEL WITH GFR  Almost no appetite -     COMPLETE METABOLIC PANEL WITH GFR -     TSH -     VITAMIN D 25 Hydroxy (Vit-D Deficiency, Fractures) -     B12 and Folate Panel -     CBC with Differential/Platelet -     Fe+TIBC+Fer  Medication management -     COMPLETE METABOLIC PANEL WITH GFR -     TSH -     VITAMIN D 25 Hydroxy (Vit-D Deficiency, Fractures) -     B12 and Folate Panel -     CBC with Differential/Platelet -     Fe+TIBC+Fer  Dizziness   Vitals look good.  PE was unremarkable Negative for any signs of infection.  Dix hallpike and orthostatic BP look good Will get labs to look at CBC, kidney, liver, electrolytes. ? Post viral fatigue Ok to use melatonin to help with getting to sleep Follow up with PCP in 32month.

## 2021-02-28 NOTE — Progress Notes (Signed)
Hepatitis panel negative.

## 2021-03-02 NOTE — Progress Notes (Signed)
No recent mono exposure. Past history of mono.

## 2021-03-02 NOTE — Progress Notes (Signed)
Can we add CMV IgM I thought it tested for both.

## 2021-03-06 LAB — EPSTEIN-BARR VIRUS VCA ANTIBODY PANEL
EBV NA IgG: 60 U/mL — ABNORMAL HIGH
EBV VCA IgG: >750 U/mL — ABNORMAL HIGH
EBV VCA IgM: <36 U/mL

## 2021-03-06 LAB — CBC WITH DIFFERENTIAL/PLATELET
Absolute Monocytes: 460 cells/uL (ref 200–950)
Basophils Absolute: 51 cells/uL (ref 0–200)
Basophils Relative: 0.7 %
Eosinophils Absolute: 58 cells/uL (ref 15–500)
Eosinophils Relative: 0.8 %
HCT: 43.3 % (ref 35.0–45.0)
Hemoglobin: 14 g/dL (ref 11.7–15.5)
Lymphs Abs: 1635 cells/uL (ref 850–3900)
MCH: 25.8 pg — ABNORMAL LOW (ref 27.0–33.0)
MCHC: 32.3 g/dL (ref 32.0–36.0)
MCV: 79.7 fL — ABNORMAL LOW (ref 80.0–100.0)
MPV: 13.1 fL — ABNORMAL HIGH (ref 7.5–12.5)
Monocytes Relative: 6.3 %
Neutro Abs: 5095 cells/uL (ref 1500–7800)
Neutrophils Relative %: 69.8 %
Platelets: 269 10*3/uL (ref 140–400)
RBC: 5.43 10*6/uL — ABNORMAL HIGH (ref 3.80–5.10)
RDW: 14.1 % (ref 11.0–15.0)
Total Lymphocyte: 22.4 %
WBC: 7.3 10*3/uL (ref 3.8–10.8)

## 2021-03-06 LAB — IRON,TIBC AND FERRITIN PANEL
%SAT: 32 % (calc) (ref 16–45)
Ferritin: 220 ng/mL (ref 16–288)
Iron: 118 ug/dL (ref 45–160)
TIBC: 367 mcg/dL (calc) (ref 250–450)

## 2021-03-06 LAB — COMPLETE METABOLIC PANEL WITH GFR
AG Ratio: 1.3 (calc) (ref 1.0–2.5)
ALT: 214 U/L — ABNORMAL HIGH (ref 6–29)
AST: 129 U/L — ABNORMAL HIGH (ref 10–35)
Albumin: 4.4 g/dL (ref 3.6–5.1)
Alkaline phosphatase (APISO): 53 U/L (ref 37–153)
BUN: 17 mg/dL (ref 7–25)
CO2: 31 mmol/L (ref 20–32)
Calcium: 9.8 mg/dL (ref 8.6–10.4)
Chloride: 100 mmol/L (ref 98–110)
Creat: 0.96 mg/dL (ref 0.60–1.00)
Globulin: 3.4 g/dL (calc) (ref 1.9–3.7)
Glucose, Bld: 122 mg/dL — ABNORMAL HIGH (ref 65–99)
Potassium: 4 mmol/L (ref 3.5–5.3)
Sodium: 140 mmol/L (ref 135–146)
Total Bilirubin: 1.1 mg/dL (ref 0.2–1.2)
Total Protein: 7.8 g/dL (ref 6.1–8.1)
eGFR: 62 mL/min/{1.73_m2} (ref >=60)

## 2021-03-06 LAB — ACUTE HEP PANEL AND HEP B SURFACE AB
HEPATITIS C ANTIBODY REFILL$(REFL): NONREACTIVE
Hep A IgM: NONREACTIVE
Hep B C IgM: NONREACTIVE
Hepatitis B Surface Ag: NONREACTIVE
SIGNAL TO CUT-OFF: 0.02 (ref <1.00)

## 2021-03-06 LAB — B12 AND FOLATE PANEL
Folate: 15.3 ng/mL
Vitamin B-12: 723 pg/mL (ref 200–1100)

## 2021-03-06 LAB — VITAMIN D 25 HYDROXY (VIT D DEFICIENCY, FRACTURES): Vit D, 25-Hydroxy: 36 ng/mL (ref 30–100)

## 2021-03-06 LAB — CYTOMEGALOVIRUS ANTIBODY, IGG: Cytomegalovirus Ab-IgG: 6.8 U/mL — ABNORMAL HIGH

## 2021-03-06 LAB — TSH: TSH: 0.82 mIU/L (ref 0.40–4.50)

## 2021-03-06 LAB — REFLEX TIQ

## 2021-03-06 LAB — CMV IGM: CMV IgM: <30 AU/mL

## 2021-03-06 NOTE — Progress Notes (Signed)
No immediate CMV or EBV.

## 2021-03-14 ENCOUNTER — Other Ambulatory Visit: Payer: Medicaid Other

## 2021-03-14 ENCOUNTER — Other Ambulatory Visit: Payer: Self-pay

## 2021-03-14 DIAGNOSIS — R748 Abnormal levels of other serum enzymes: Secondary | ICD-10-CM

## 2021-03-14 LAB — COMPLETE METABOLIC PANEL WITH GFR
AG Ratio: 1.3 (calc) (ref 1.0–2.5)
ALT: 169 U/L — ABNORMAL HIGH (ref 6–29)
AST: 94 U/L — ABNORMAL HIGH (ref 10–35)
Albumin: 4.3 g/dL (ref 3.6–5.1)
Alkaline phosphatase (APISO): 50 U/L (ref 37–153)
BUN: 24 mg/dL (ref 7–25)
CO2: 33 mmol/L — ABNORMAL HIGH (ref 20–32)
Calcium: 10.2 mg/dL (ref 8.6–10.4)
Chloride: 100 mmol/L (ref 98–110)
Creat: 0.95 mg/dL (ref 0.60–1.00)
Globulin: 3.3 g/dL (calc) (ref 1.9–3.7)
Glucose, Bld: 108 mg/dL — ABNORMAL HIGH (ref 65–99)
Potassium: 3.5 mmol/L (ref 3.5–5.3)
Sodium: 141 mmol/L (ref 135–146)
Total Bilirubin: 1.1 mg/dL (ref 0.2–1.2)
Total Protein: 7.6 g/dL (ref 6.1–8.1)
eGFR: 63 mL/min/{1.73_m2} (ref >=60)

## 2021-03-14 NOTE — Progress Notes (Signed)
Ordered labs per result notes.  °

## 2021-03-19 ENCOUNTER — Other Ambulatory Visit: Payer: Self-pay | Admitting: Physician Assistant

## 2021-03-19 DIAGNOSIS — R748 Abnormal levels of other serum enzymes: Secondary | ICD-10-CM

## 2021-03-19 NOTE — Progress Notes (Signed)
Liver enzymes are improving and looks like this could be some post viral inflammation as suspected. I would still like to proceed with abdominal ultrasound and also recheck liver enzymes in 4 weeks.

## 2021-03-19 NOTE — Progress Notes (Signed)
Abdominal ultrasound ordered.

## 2021-03-26 ENCOUNTER — Other Ambulatory Visit: Payer: Self-pay

## 2021-03-26 ENCOUNTER — Encounter: Payer: Self-pay | Admitting: Physician Assistant

## 2021-03-26 ENCOUNTER — Ambulatory Visit: Payer: Medicaid Other | Admitting: Physician Assistant

## 2021-03-26 VITALS — BP 146/88 | HR 91 | Ht 65.0 in | Wt 145.0 lb

## 2021-03-26 DIAGNOSIS — N289 Disorder of kidney and ureter, unspecified: Secondary | ICD-10-CM | POA: Diagnosis not present

## 2021-03-26 DIAGNOSIS — R42 Dizziness and giddiness: Secondary | ICD-10-CM

## 2021-03-26 DIAGNOSIS — R748 Abnormal levels of other serum enzymes: Secondary | ICD-10-CM | POA: Diagnosis not present

## 2021-03-26 DIAGNOSIS — R5383 Other fatigue: Secondary | ICD-10-CM | POA: Diagnosis not present

## 2021-03-26 NOTE — Patient Instructions (Signed)
Get labs. ?Start exercises.  ?Go downstairs and schedule ultrasound.  ?

## 2021-03-26 NOTE — Progress Notes (Signed)
? ?Subjective:  ? ? Patient ID: Katrina Baird, female    DOB: 02-11-1950, 71 y.o.   MRN: [id number] ? ?HPI ?Pt is a 71 yo female who presents to the clinic with her son who also interprets for her to discuss worsening fatigue and dizziness.  ? ?Pt was seen and found to have elevated liver enzymes. They have been trending down but not normalized. Negative for hepatitis/CMV/EBV. She has not gone to get ultrasound yet. Denies any vomiting but she is nauseated. She has no appetite. Her bowel movements are unchanged. Denies any melena or hematochezia. No abdominal pain or reflux. No urinary symptoms or fever. Dizziness with any position change.  ? ?.. ?Active Ambulatory Problems  ?  Diagnosis Date Noted  ? Thoracic back pain 09/23/2012  ? Essential hypertension, benign 09/23/2012  ? Hyperlipidemia with target LDL less than 130 09/23/2012  ? Visit for screening mammogram 03/08/2013  ? Insomnia 03/08/2013  ? Colon cancer screening 03/30/2014  ? Axillary lump 01/31/2015  ? Osteoporosis 02/03/2015  ? Glucose intolerance (impaired glucose tolerance) 06/20/2020  ? Biceps tendinitis, left, distal 12/25/2020  ? Elevated liver enzymes 02/27/2021  ? Dizziness 02/27/2021  ? Almost no appetite 02/27/2021  ? No energy 02/27/2021  ? ?Resolved Ambulatory Problems  ?  Diagnosis Date Noted  ? Anemia 09/23/2012  ? Other abnormal glucose 09/23/2012  ? Leukocytosis, unspecified 09/23/2012  ? Osteoporosis, unspecified 09/23/2012  ? Routine general medical examination at a health care facility 03/08/2013  ? ?Past Medical History:  ?Diagnosis Date  ? Hypertension   ? Vertigo   ? ? ? ? ? ?Review of Systems ?See HPI.  ?   ?Objective:  ? Physical Exam ?Vitals reviewed.  ?Constitutional:   ?   Appearance: Normal appearance. She is obese. She is not ill-appearing.  ?HENT:  ?   Head: Normocephalic.  ?   Right Ear: Tympanic membrane, ear canal and external ear normal. There is no impacted cerumen.  ?   Left Ear: Tympanic membrane, ear canal and external ear  normal. There is no impacted cerumen.  ?   Nose: Nose normal.  ?Cardiovascular:  ?   Rate and Rhythm: Normal rate and regular rhythm.  ?   Pulses: Normal pulses.  ?Pulmonary:  ?   Effort: Pulmonary effort is normal.  ?   Breath sounds: Normal breath sounds.  ?Abdominal:  ?   General: Bowel sounds are normal. There is no distension.  ?   Palpations: Abdomen is soft. There is no mass.  ?   Tenderness: There is no abdominal tenderness. There is no right CVA tenderness, left CVA tenderness, guarding or rebound.  ?   Hernia: No hernia is present.  ?Musculoskeletal:  ?   Right lower leg: No edema.  ?   Left lower leg: No edema.  ?Neurological:  ?   General: No focal deficit present.  ?   Mental Status: She is alert and oriented to person, place, and time.  ?   Comments: Negative dix hallpike  ?Psychiatric:     ?   Mood and Affect: Mood normal.  ? ? ? ?Marland Kitchen.Orthostatic VS for the past 24 hrs (Last 3 readings): ? BP- Sitting Pulse- Sitting BP- Standing at 0 minutes Pulse- Standing at 0 minutes BP- Standing at 3 minutes Pulse- Standing at 3 minutes  ?03/26/21 1332 130/78 80 136/81 79 123/84 85  ? ? ? ?   ?Assessment & Plan:  ?..Katrina Baird was seen today for follow-up. ? ?Diagnoses and all  orders for this visit: ? ?Elevated liver enzymes ?-     CBC w/Diff/Platelet ?-     Hepatic function panel ?-     BASIC METABOLIC PANEL WITH GFR ? ?Dizziness ?-     CBC w/Diff/Platelet ?-     Hepatic function panel ?-     BASIC METABOLIC PANEL WITH GFR ? ?No energy ?-     CBC w/Diff/Platelet ?-     Hepatic function panel ?-     BASIC METABOLIC PANEL WITH GFR ? ? ?Orthostatic BP within normal limits ?Dix hallpike negative but having dizziness with position changes ?Start epley manuevers ?Recheck liver enzymes, cbc, bmp. ?Get ultrasound of abdomen done.  ?Consider GI referral if u/s normal and liver enzymes still elevated.  ?Follow up after ultrasound and labs.  ? ?

## 2021-03-27 LAB — BASIC METABOLIC PANEL WITH GFR
BUN/Creatinine Ratio: 25 (calc) — ABNORMAL HIGH (ref 6–22)
BUN: 25 mg/dL (ref 7–25)
CO2: 33 mmol/L — ABNORMAL HIGH (ref 20–32)
Calcium: 9.5 mg/dL (ref 8.6–10.4)
Chloride: 101 mmol/L (ref 98–110)
Creat: 1.01 mg/dL — ABNORMAL HIGH (ref 0.60–1.00)
Glucose, Bld: 98 mg/dL (ref 65–99)
Potassium: 3.8 mmol/L (ref 3.5–5.3)
Sodium: 141 mmol/L (ref 135–146)
eGFR: 59 mL/min/{1.73_m2} — ABNORMAL LOW (ref >=60)

## 2021-03-27 LAB — CBC WITH DIFFERENTIAL/PLATELET
Absolute Monocytes: 554 cells/uL (ref 200–950)
Basophils Absolute: 47 cells/uL (ref 0–200)
Basophils Relative: 0.6 %
Eosinophils Absolute: 101 cells/uL (ref 15–500)
Eosinophils Relative: 1.3 %
HCT: 42.9 % (ref 35.0–45.0)
Hemoglobin: 13.9 g/dL (ref 11.7–15.5)
Lymphs Abs: 2254 cells/uL (ref 850–3900)
MCH: 25.5 pg — ABNORMAL LOW (ref 27.0–33.0)
MCHC: 32.4 g/dL (ref 32.0–36.0)
MCV: 78.6 fL — ABNORMAL LOW (ref 80.0–100.0)
MPV: 12.9 fL — ABNORMAL HIGH (ref 7.5–12.5)
Monocytes Relative: 7.1 %
Neutro Abs: 4844 cells/uL (ref 1500–7800)
Neutrophils Relative %: 62.1 %
Platelets: 269 10*3/uL (ref 140–400)
RBC: 5.46 10*6/uL — ABNORMAL HIGH (ref 3.80–5.10)
RDW: 13.9 % (ref 11.0–15.0)
Total Lymphocyte: 28.9 %
WBC: 7.8 10*3/uL (ref 3.8–10.8)

## 2021-03-27 LAB — HEPATIC FUNCTION PANEL
AG Ratio: 1.2 (calc) (ref 1.0–2.5)
ALT: 193 U/L — ABNORMAL HIGH (ref 6–29)
AST: 105 U/L — ABNORMAL HIGH (ref 10–35)
Albumin: 4.3 g/dL (ref 3.6–5.1)
Alkaline phosphatase (APISO): 44 U/L (ref 37–153)
Bilirubin, Direct: 0.2 mg/dL (ref 0.0–0.2)
Globulin: 3.5 g/dL (calc) (ref 1.9–3.7)
Indirect Bilirubin: 0.6 mg/dL (calc) (ref 0.2–1.2)
Total Bilirubin: 0.8 mg/dL (ref 0.2–1.2)
Total Protein: 7.8 g/dL (ref 6.1–8.1)

## 2021-03-28 NOTE — Progress Notes (Signed)
WBC stable.  ?Hemoglobin stable.  ?Liver enzymes not trending down and kidney function is declining.  ?Have you had u/s done yet? If not I will go ahead and order CT.

## 2021-04-02 NOTE — Progress Notes (Signed)
Get ultrasound first.

## 2021-04-04 ENCOUNTER — Other Ambulatory Visit: Payer: Self-pay

## 2021-04-04 ENCOUNTER — Ambulatory Visit (INDEPENDENT_AMBULATORY_CARE_PROVIDER_SITE_OTHER): Payer: Medicaid Other

## 2021-04-04 DIAGNOSIS — R748 Abnormal levels of other serum enzymes: Secondary | ICD-10-CM | POA: Diagnosis not present

## 2021-04-04 DIAGNOSIS — K7689 Other specified diseases of liver: Secondary | ICD-10-CM | POA: Diagnosis not present

## 2021-04-09 ENCOUNTER — Ambulatory Visit: Payer: Medicaid Other | Admitting: Family Medicine

## 2021-04-10 NOTE — Progress Notes (Signed)
No acute process. You do have some very small lesions noted on liver.  Follow up with PCP and GI  to consider CT for further work up.

## 2021-04-19 ENCOUNTER — Ambulatory Visit: Payer: Medicaid Other | Admitting: Physician Assistant

## 2021-04-19 VITALS — BP 117/80 | HR 94 | Ht 65.0 in | Wt 148.0 lb

## 2021-04-19 DIAGNOSIS — R5383 Other fatigue: Secondary | ICD-10-CM | POA: Diagnosis not present

## 2021-04-19 DIAGNOSIS — N179 Acute kidney failure, unspecified: Secondary | ICD-10-CM | POA: Insufficient documentation

## 2021-04-19 DIAGNOSIS — R748 Abnormal levels of other serum enzymes: Secondary | ICD-10-CM

## 2021-04-19 DIAGNOSIS — R630 Anorexia: Secondary | ICD-10-CM | POA: Diagnosis not present

## 2021-04-19 DIAGNOSIS — R110 Nausea: Secondary | ICD-10-CM | POA: Diagnosis not present

## 2021-04-19 MED ORDER — PANTOPRAZOLE SODIUM 40 MG PO TBEC: 40.0000 mg | DELAYED_RELEASE_TABLET | Freq: Every day | ORAL | 3 refills | Status: DC

## 2021-04-19 NOTE — Patient Instructions (Signed)
Will get labs ?Start protonix daily ?Get in with GI.  ?

## 2021-04-19 NOTE — Progress Notes (Signed)
? ?Subjective:  ? ? Patient ID: Katrina Baird, female    DOB: Apr 15, 1950, 71 y.o.   MRN: [id number] ? ?HPI ?Pt is a 71 yo female who presents to the clinic with her son to interpret for her to follow up on nausea, no appetite, fatigue, and elevated liver enzymes. Pt liver enzymes are elevated and kidney function has declined some over past few weeks. [place] was done of abdomen with no acute findings but some non-specific liver lesion. Pt continues to feel tired and nauseated with no appetite. She does not feel like she is getting worse but just not better. No fever, chills, body aches, abdominal pain, diarrhea, constipation, hematochezia or melena.  ? ? ?.. ?Active Ambulatory Problems  ?  Diagnosis Date Noted  ? Thoracic back pain 09/23/2012  ? Essential hypertension, benign 09/23/2012  ? Hyperlipidemia with target LDL less than 130 09/23/2012  ? Visit for screening mammogram 03/08/2013  ? Insomnia 03/08/2013  ? Colon cancer screening 03/30/2014  ? Axillary lump 01/31/2015  ? Osteoporosis 02/03/2015  ? Glucose intolerance (impaired glucose tolerance) 06/20/2020  ? Biceps tendinitis, left, distal 12/25/2020  ? Elevated liver enzymes 02/27/2021  ? Dizziness 02/27/2021  ? No appetite 02/27/2021  ? Other fatigue 02/27/2021  ? AKI (acute kidney injury) (HCC) 04/19/2021  ? Nausea 04/23/2021  ? ?Resolved Ambulatory Problems  ?  Diagnosis Date Noted  ? Anemia 09/23/2012  ? Other abnormal glucose 09/23/2012  ? Leukocytosis, unspecified 09/23/2012  ? Osteoporosis, unspecified 09/23/2012  ? Routine general medical examination at a health care facility 03/08/2013  ? ?Past Medical History:  ?Diagnosis Date  ? Hypertension   ? Vertigo   ? ? ?Review of Systems ? ?  See HPI.  ?Objective:  ? Physical Exam ?Vitals reviewed.  ?Constitutional:   ?   Appearance: Normal appearance.  ?HENT:  ?   Head: Normocephalic.  ?Cardiovascular:  ?   Rate and Rhythm: Normal rate and regular rhythm.  ?   Pulses: Normal pulses.  ?   Heart sounds: Normal heart  sounds.  ?Pulmonary:  ?   Effort: Pulmonary effort is normal.  ?   Breath sounds: Normal breath sounds.  ?Abdominal:  ?   General: Bowel sounds are normal. There is no distension.  ?   Palpations: Abdomen is soft. There is no mass.  ?   Tenderness: There is no abdominal tenderness. There is no right CVA tenderness, left CVA tenderness, guarding or rebound.  ?   Hernia: No hernia is present.  ?Musculoskeletal:  ?   Right lower leg: No edema.  ?   Left lower leg: No edema.  ?Neurological:  ?   General: No focal deficit present.  ?   Mental Status: She is alert and oriented to person, place, and time.  ?Psychiatric:     ?   Mood and Affect: Mood normal.  ? ? ? ? ? ?   ?Assessment & Plan:  ?..Jordyn was seen today for follow-up. ? ?Diagnoses and all orders for this visit: ? ?Other fatigue ?-     COMPLETE METABOLIC PANEL WITH GFR ? ?Elevated liver enzymes ?-     Ambulatory referral to Gastroenterology ?-     COMPLETE METABOLIC PANEL WITH GFR ? ?AKI (acute kidney injury) (HCC) ?-     Ambulatory referral to Gastroenterology ?-     COMPLETE METABOLIC PANEL WITH GFR ? ?No appetite ?-     Ambulatory referral to Gastroenterology ?-     pantoprazole (PROTONIX) 40 MG  tablet; Take 1 tablet (40 mg total) by mouth daily. ?-     COMPLETE METABOLIC PANEL WITH GFR ? ?Nausea ?-     Ambulatory referral to Gastroenterology ?-     pantoprazole (PROTONIX) 40 MG tablet; Take 1 tablet (40 mg total) by mouth daily. ?-     COMPLETE METABOLIC PANEL WITH GFR ? ? ?Reassuring pt vitals look great and no pain on exam.  ?Recheck liver enzymes and kidney function.  ?Referral to GI for more evaluation.  ?Added protonix to see if some of her symptoms could be due to GERD.  ?Follow up with PCP.  ? ? ?

## 2021-04-20 LAB — COMPLETE METABOLIC PANEL WITH GFR
AG Ratio: 1.1 (calc) (ref 1.0–2.5)
ALT: 251 U/L — ABNORMAL HIGH (ref 6–29)
AST: 138 U/L — ABNORMAL HIGH (ref 10–35)
Albumin: 4.2 g/dL (ref 3.6–5.1)
Alkaline phosphatase (APISO): 51 U/L (ref 37–153)
BUN/Creatinine Ratio: 35 (calc) — ABNORMAL HIGH (ref 6–22)
BUN: 27 mg/dL — ABNORMAL HIGH (ref 7–25)
CO2: 30 mmol/L (ref 20–32)
Calcium: 9.7 mg/dL (ref 8.6–10.4)
Chloride: 101 mmol/L (ref 98–110)
Creat: 0.77 mg/dL (ref 0.60–1.00)
Globulin: 3.7 g/dL (calc) (ref 1.9–3.7)
Glucose, Bld: 96 mg/dL (ref 65–139)
Potassium: 3.5 mmol/L (ref 3.5–5.3)
Sodium: 140 mmol/L (ref 135–146)
Total Bilirubin: 1 mg/dL (ref 0.2–1.2)
Total Protein: 7.9 g/dL (ref 6.1–8.1)
eGFR: 81 mL/min/{1.73_m2} (ref >=60)

## 2021-04-23 ENCOUNTER — Encounter: Payer: Self-pay | Admitting: Physician Assistant

## 2021-04-23 DIAGNOSIS — R110 Nausea: Secondary | ICD-10-CM | POA: Insufficient documentation

## 2021-04-23 NOTE — Progress Notes (Signed)
Cindy please make sure pt sees GI soon!  ? ?Kidney function improved a lot which is great.  ?Liver enzymes up more. Needs the GI appt for more evaluation.

## 2021-04-25 ENCOUNTER — Ambulatory Visit: Payer: Medicaid Other | Admitting: Osteopathic Medicine

## 2021-04-25 ENCOUNTER — Ambulatory Visit: Payer: Medicaid Other | Admitting: Medical-Surgical

## 2021-06-04 ENCOUNTER — Ambulatory Visit: Payer: Medicaid Other | Admitting: Family Medicine

## 2021-06-13 ENCOUNTER — Ambulatory Visit: Payer: Medicaid Other | Admitting: Family Medicine

## 2021-06-13 ENCOUNTER — Encounter: Payer: Self-pay | Admitting: Sports Medicine

## 2021-06-13 ENCOUNTER — Other Ambulatory Visit: Payer: Self-pay | Admitting: Osteopathic Medicine

## 2021-06-13 ENCOUNTER — Ambulatory Visit: Payer: Medicaid Other | Admitting: Sports Medicine

## 2021-06-13 DIAGNOSIS — R748 Abnormal levels of other serum enzymes: Secondary | ICD-10-CM

## 2021-06-13 DIAGNOSIS — I951 Orthostatic hypotension: Secondary | ICD-10-CM | POA: Diagnosis not present

## 2021-06-13 DIAGNOSIS — R8281 Pyuria: Secondary | ICD-10-CM | POA: Diagnosis not present

## 2021-06-13 LAB — POCT URINALYSIS DIP (CLINITEK)
Bilirubin, UA: NEGATIVE
Blood, UA: NEGATIVE
Glucose, UA: NEGATIVE mg/dL
Ketones, POC UA: NEGATIVE mg/dL
Nitrite, UA: NEGATIVE
POC PROTEIN,UA: NEGATIVE
Spec Grav, UA: 1.015 (ref 1.010–1.025)
Urobilinogen, UA: 0.2 E.U./dL
pH, UA: 7 (ref 5.0–8.0)

## 2021-06-13 MED ORDER — NITROFURANTOIN MONOHYD MACRO 100 MG PO CAPS: 100.0000 mg | ORAL_CAPSULE | Freq: Two times a day (BID) | ORAL | 0 refills | Status: DC

## 2021-06-13 NOTE — Assessment & Plan Note (Signed)
Pleasant 71 year old female, history of hypertension and hyperlipidemia, currently treated with losartan, hydrochlorothiazide, atorvastatin. For the past several months has had increasing weakness, dizziness on standing. No nausea, vomiting, diarrhea, no chest pain. No fevers, chills. Mood is okay. Her blood pressures in the 120s systolic today, I think it is probably a little bit low for her so I will go ahead and discontinue her hydrochlorothiazide and losartan for now. I would like her to hydrate aggressively and we may restart losartan 50 mg daily in several weeks.

## 2021-06-13 NOTE — Progress Notes (Signed)
    Procedures performed today:    None.  Independent interpretation of notes and tests performed by another provider:   None.  Brief History, Exam, Impression, and Recommendations:    Orthostatic hypotension Pleasant 71 year old female, history of hypertension and hyperlipidemia, currently treated with losartan, hydrochlorothiazide, atorvastatin. For the past several months has had increasing weakness, dizziness on standing. No nausea, vomiting, diarrhea, no chest pain. No fevers, chills. Mood is okay. Her blood pressures in the 120s systolic today, I think it is probably a little bit low for her so I will go ahead and discontinue her hydrochlorothiazide and losartan for now. I would like her to hydrate aggressively and we may restart losartan 50 mg daily in several weeks.  Elevated liver enzymes Has also had elevated liver liver enzymes into the 200s-300s, has had a good work-up including acute hepatitis panel, CMV, EBV. Ultrasound of the abdomen unrevealing. She is on very high-dose Lipitor which I think may be the cause, discontinue Lipitor for now and we can recheck liver function in a month. She has not yet seen the gastroenterologist as recommended previously as the language barrier created a misunderstanding. I have advised her to proceed with making the appointment.  Pyuria In addition to all of the below there was pyuria today. As we know this can create vague symptoms in the elderly, we will culture the urine and I am going to add Macrobid.  I spent 40 minutes of total time managing this patient today, this includes chart review, face to face, and non-face to face time.  ___________________________________________ Ihor Austin. Benjamin Stain, M.D., ABFM., CAQSM. Primary Care and Sports Medicine [facility]  Adjunct Instructor of Family Medicine  [employer]

## 2021-06-13 NOTE — Assessment & Plan Note (Signed)
In addition to all of the below there was pyuria today. As we know this can create vague symptoms in the elderly, we will culture the urine and I am going to add Macrobid.

## 2021-06-13 NOTE — Assessment & Plan Note (Signed)
Has also had elevated liver liver enzymes into the 200s-300s, has had a good work-up including acute hepatitis panel, CMV, EBV. Ultrasound of the abdomen unrevealing. She is on very high-dose Lipitor which I think may be the cause, discontinue Lipitor for now and we can recheck liver function in a month. She has not yet seen the gastroenterologist as recommended previously as the language barrier created a misunderstanding. I have advised her to proceed with making the appointment.

## 2021-06-13 NOTE — Telephone Encounter (Signed)
[facility] requesting med refill for HCTZ. Rx not listed in active med list.

## 2021-06-15 DIAGNOSIS — R8281 Pyuria: Secondary | ICD-10-CM | POA: Diagnosis not present

## 2021-06-15 NOTE — Addendum Note (Signed)
Addended by: Malva Cogan L on: 06/15/2021 11:40 AM   Modules accepted: Orders

## 2021-06-17 LAB — URINE CULTURE
MICRO NUMBER:: 13480842
Result:: NO GROWTH
SPECIMEN QUALITY:: ADEQUATE

## 2021-06-20 ENCOUNTER — Emergency Department
Admission: EM | Admit: 2021-06-20 | Discharge: 2021-06-20 | Disposition: A | Discharge status: Home / Self Care | Payer: Medicaid Other | Source: Home / Self Care

## 2021-06-20 ENCOUNTER — Telehealth: Payer: Self-pay

## 2021-06-20 DIAGNOSIS — R42 Dizziness and giddiness: Secondary | ICD-10-CM

## 2021-06-20 NOTE — ED Provider Notes (Signed)
Katrina Baird [facility]    CSN: [id number] Arrival date & time: 06/20/21  9390      History   Chief Complaint Chief Complaint  Patient presents with   Fatigue   Nausea   Dizziness    HPI [name] is a 71 y.o. female.   HPI 71 year old female presents with fatigue, nausea and dizziness.  PMH significant for orthostatic hypotension, dizziness, fatigue, and AKI.  Patient is accompanied by her granddaughter this morning who reports PCP discontinued all BP and HLD medication at last office visit.  Past Medical History:  Diagnosis Date   Hyperlipidemia with target LDL less than 130 09/23/2012   Hypertension    Osteoporosis 02/03/2015   Vertigo     Patient Active Problem List   Diagnosis Date Noted   Orthostatic hypotension 06/13/2021   Pyuria 06/13/2021   Nausea 04/23/2021   AKI (acute kidney injury) (HCC) 04/19/2021   Elevated liver enzymes 02/27/2021   Dizziness 02/27/2021   No appetite 02/27/2021   Other fatigue 02/27/2021   Biceps tendinitis, left, distal 12/25/2020   Glucose intolerance (impaired glucose tolerance) 06/20/2020   Osteoporosis 02/03/2015   Axillary lump 01/31/2015   Colon cancer screening 03/30/2014   Visit for screening mammogram 03/08/2013   Insomnia 03/08/2013   Thoracic back pain 09/23/2012   Essential hypertension, benign 09/23/2012   Hyperlipidemia with target LDL less than 130 09/23/2012    History reviewed. No pertinent surgical history.  OB History   No obstetric history on file.      Home Medications    Prior to Admission medications   Medication Sig Start Date End Date Taking? Authorizing Provider  atorvastatin (LIPITOR) 80 MG tablet Take 80 mg by mouth daily.   Yes [provider]  hydrochlorothiazide (MICROZIDE) 12.5 MG capsule Take 12.5 mg by mouth daily.   Yes [provider]  losartan (COZAAR) 50 MG tablet Take 50 mg by mouth daily.   Yes [provider]  potassium chloride (KLOR-CON) 10 MEQ tablet  Take 10 mEq by mouth daily.   Yes [provider]  alendronate (FOSAMAX) 10 MG tablet Take 1 tablet (10 mg total) by mouth daily before breakfast. Take with a full glass of water on an empty stomach. 06/20/20   Sunnie Nielsen, DO  nitrofurantoin, macrocrystal-monohydrate, (MACROBID) 100 MG capsule Take 1 capsule (100 mg total) by mouth 2 (two) times daily. Patient not taking: Reported on 06/20/2021 06/13/21   Monica Becton, MD  pantoprazole (PROTONIX) 40 MG tablet Take 1 tablet (40 mg total) by mouth daily. 04/19/21   Jomarie Longs, PA-C    Family History Family History  Problem Relation Age of Onset   Cancer Neg Hx    Diabetes Neg Hx    Early death Neg Hx    Heart disease Neg Hx    Hyperlipidemia Neg Hx    Hypertension Neg Hx    Kidney disease Neg Hx    Stroke Neg Hx     Social History Social History   Tobacco Use   Smoking status: Never   Smokeless tobacco: Never  Substance Use Topics   Alcohol use: No   Drug use: No     Allergies   Patient has no known allergies.   Review of Systems Review of Systems  Constitutional:  Positive for fatigue.  Gastrointestinal:  Positive for nausea.  Neurological:  Positive for dizziness.  All other systems reviewed and are negative.   Physical Exam Triage Vital Signs ED Triage Vitals  Enc Vitals Group     BP 06/20/21 0946 (!) 150/86     Pulse Rate 06/20/21 0946 97     Resp 06/20/21 0946 14     Temp 06/20/21 0946 98.9 F (37.2 C)     Temp Source 06/20/21 0946 Oral     SpO2 06/20/21 0946 96 %     Weight --      Height --      Head Circumference --      Peak Flow --      Pain Score 06/20/21 0950 0     Pain Loc --      Pain Edu? --      Excl. in GC? --    No data found.  Updated Vital Signs BP (!) 150/86 (BP Location: Left Arm)   Pulse 97   Temp 98.9 F (37.2 C) (Oral)   Resp 14   SpO2 96%   Physical Exam Vitals and nursing note reviewed.  Constitutional:      General: She is not in acute  distress.    Appearance: Normal appearance. She is normal weight. She is not ill-appearing.  HENT:     Head: Normocephalic and atraumatic.     Right Ear: Tympanic membrane, ear canal and external ear normal.     Left Ear: Tympanic membrane, ear canal and external ear normal.     Mouth/Throat:     Mouth: Mucous membranes are moist.     Pharynx: Oropharynx is clear.  Eyes:     Extraocular Movements: Extraocular movements intact.     Conjunctiva/sclera: Conjunctivae normal.     Pupils: Pupils are equal, round, and reactive to light.  Neck:     Comments: No JVD, no bruit Cardiovascular:     Rate and Rhythm: Normal rate and regular rhythm.     Pulses: Normal pulses.     Heart sounds: Normal heart sounds.     Comments: Left arm seated 132/88 manual by me; PT/DP +2 bounding bilaterally Pulmonary:     Effort: Pulmonary effort is normal.     Breath sounds: Normal breath sounds. No wheezing, rhonchi or rales.  Musculoskeletal:     Cervical back: Normal range of motion and neck supple. No tenderness.     Right lower leg: No edema.     Left lower leg: No edema.  Lymphadenopathy:     Cervical: No cervical adenopathy.  Skin:    General: Skin is warm and dry.  Neurological:     General: No focal deficit present.     Mental Status: She is alert and oriented to person, place, and time.     UC Treatments / Results  Labs (all labs ordered are listed, but only abnormal results are displayed) Labs Reviewed - No data to display  EKG   Radiology No results found.  Procedures Procedures (including critical care time)  Medications Ordered in UC Medications - No data to display  Initial Impression / Assessment and Plan / UC Course  I have reviewed the triage vital signs and the nursing notes.  Pertinent labs & imaging results that were available during my care of the patient were reviewed by me and considered in my medical decision making (see chart for details).     MDM: 1.   Dizziness-EKG wnl. Encouraged patient to increase daily water intake to 32-52 ounces per day. Advised patient/granddaughter to check blood pressure after voiding and prior to eating.  Advised to log these measurements so that PCP can  evaluate daily blood pressure trends.  Advised patient/granddaughter if dizziness persists and/or unresolved please follow-up with PCP for further evaluation.  Patient discharged home, hemodynamically stable.  Final Clinical Impressions(s) / UC Diagnoses   Final diagnoses:  Dizziness     Discharge Instructions      Encouraged patient to increase daily water intake to 32-52 ounces per day. Advised patient/granddaughter to check blood pressure after voiding and prior to eating.  Advised to log these measurements so that PCP can evaluate daily blood pressure trends.  Advised patient/granddaughter if dizziness persists and/or unresolved please follow-up with PCP for further evaluation.     ED Prescriptions   None    PDMP not reviewed this encounter.   Trevor IhaRagan, [place], FNP 06/20/21 1034

## 2021-06-20 NOTE — Telephone Encounter (Signed)
Per granddaughter, pt's BP got to the 170s and they restarted her meds. They don't know what to do. You wanted her to stop the meds due to the dizziness but the dizziness did not go away even after stopping the meds. Please advise.

## 2021-06-20 NOTE — Telephone Encounter (Signed)
Left msg with directions on VM.

## 2021-06-20 NOTE — ED Triage Notes (Signed)
Pt presents with c/o nausea, dizziness, and fatigue for "several months". Pt states that they have altered her BP medications to improve sx. Pts stopped taking her BP medication at the end of last month and restarted medications Sunday. Since restarting medications her nausea, dizziness, and fatigue have returned and are worse today.

## 2021-06-20 NOTE — Discharge Instructions (Addendum)
Encouraged patient to increase daily water intake to 32-52 ounces per day. Advised patient/granddaughter to check blood pressure after voiding and prior to eating.  Advised to log these measurements so that PCP can evaluate daily blood pressure trends.  Advised patient/granddaughter if dizziness persists and/or unresolved please follow-up with PCP for further evaluation.

## 2021-06-20 NOTE — Telephone Encounter (Signed)
Restart only the losartan, but not the hydrochlorothiazide.

## 2021-07-04 ENCOUNTER — Encounter: Payer: Self-pay | Admitting: Family Medicine

## 2021-07-04 ENCOUNTER — Ambulatory Visit: Payer: Medicaid Other | Admitting: Family Medicine

## 2021-07-04 VITALS — BP 168/93 | HR 76 | Ht 65.0 in | Wt 147.0 lb

## 2021-07-04 DIAGNOSIS — R42 Dizziness and giddiness: Secondary | ICD-10-CM | POA: Diagnosis not present

## 2021-07-04 DIAGNOSIS — R5383 Other fatigue: Secondary | ICD-10-CM

## 2021-07-04 DIAGNOSIS — R7989 Other specified abnormal findings of blood chemistry: Secondary | ICD-10-CM | POA: Diagnosis not present

## 2021-07-04 DIAGNOSIS — R748 Abnormal levels of other serum enzymes: Secondary | ICD-10-CM

## 2021-07-04 DIAGNOSIS — Z1231 Encounter for screening mammogram for malignant neoplasm of breast: Secondary | ICD-10-CM | POA: Diagnosis not present

## 2021-07-04 DIAGNOSIS — I10 Essential (primary) hypertension: Secondary | ICD-10-CM | POA: Diagnosis not present

## 2021-07-04 DIAGNOSIS — M810 Age-related osteoporosis without current pathological fracture: Secondary | ICD-10-CM

## 2021-07-04 DIAGNOSIS — I951 Orthostatic hypotension: Secondary | ICD-10-CM

## 2021-07-04 MED ORDER — LOSARTAN POTASSIUM 100 MG PO TABS
100.0000 mg | ORAL_TABLET | Freq: Every day | ORAL | 0 refills | Status: DC
Start: 2021-07-04 — End: 2021-07-27

## 2021-07-04 NOTE — Assessment & Plan Note (Signed)
Unclear etiology of her fatigue.  She has had extensive lab work-up.  Denies depressive symptoms.

## 2021-07-04 NOTE — Assessment & Plan Note (Signed)
Continues to have episodes of dizziness.  We will refer her to vestibular rehab to see if this provides any relief for her symptoms.

## 2021-07-04 NOTE — Progress Notes (Signed)
Katrina Baird - 71 y.o. female MRN [id number]  Date of birth: Nov 25, 1950  Subjective Chief Complaint  Patient presents with   Hypertension  Due to language barrier, an interpreter was present during the history-taking and subsequent discussion (and for part of the physical exam) with this patient.   HPI Katrina Baird is a 71 year old female here today for follow-up visit.  She has been experiencing recurrent episodes of dizziness.  Initially seen by Tandy Gaw back in February due to decreased energy.  Noted to have elevated LFTs.  She had extensive work-up for this without any significant findings.  Recently had follow-up with Dr. Benjamin Stain.  He decided to try and have her discontinue her atorvastatin.  Additionally found to be orthostatic.  Hydrochlorothiazide was discontinued.  She has continued on losartan.  He brings a list of blood pressure readings from home which are elevated.  These are similar to readings here in the clinic.  In regards to her dizziness she does describe this as feeling like the room is spinning at times.  This is worse when she first gets up.  She has not had any nausea associated with this.  She denies chest pain, palpitations or shortness of breath.  She does continue on Fosamax for management of osteoporosis.  She is due for updated DEXA.  ROS:  A comprehensive ROS was completed and negative except as noted per HPI    No Known Allergies  Past Medical History:  Diagnosis Date   Hyperlipidemia with target LDL less than 130 09/23/2012   Hypertension    Osteoporosis 02/03/2015   Vertigo     History reviewed. No pertinent surgical history.  Social History   Socioeconomic History   Marital status: Married    Spouse name: Not on file   Number of children: Not on file   Years of education: Not on file   Highest education level: Not on file  Occupational History   Not on file  Tobacco Use   Smoking status: Never   Smokeless tobacco: Never  Substance and  Sexual Activity   Alcohol use: No   Drug use: No   Sexual activity: Not Currently  Other Topics Concern   Not on file  Social History Narrative   Not on file   Social Determinants of Health   Financial Resource Strain: Not on file  Food Insecurity: Not on file  Transportation Needs: Not on file  Physical Activity: Not on file  Stress: Not on file  Social Connections: Not on file    Family History  Problem Relation Age of Onset   Cancer Neg Hx    Diabetes Neg Hx    Early death Neg Hx    Heart disease Neg Hx    Hyperlipidemia Neg Hx    Hypertension Neg Hx    Kidney disease Neg Hx    Stroke Neg Hx     Health Maintenance  Topic Date Due   Zoster Vaccines- Shingrix (1 of 2) 10/04/2021 (Originally 02/03/1967)   COLONOSCOPY (Pts 45-36yrs Insurance coverage will need to be confirmed)  12/11/2021 (Originally 02/02/1993)   MAMMOGRAM  04/20/2022 (Originally 04/06/2021)   INFLUENZA VACCINE  08/14/2021   TETANUS/TDAP  11/03/2022   Pneumonia Vaccine 86+ Years old  Completed   DEXA SCAN  Completed   COVID-19 Vaccine  Completed   Hepatitis C Screening  Completed   HPV VACCINES  Aged Out     ----------------------------------------------------------------------------------------------------------------------------------------------------------------------------------------------------------------- Physical Exam BP (!) 168/93 (BP Location: Left Arm, Patient Position: Sitting,  Cuff Size: Normal)   Pulse 76   Ht 5\' 5"  (1.651 m)   Wt 147 lb (66.7 kg)   SpO2 97%   BMI 24.46 kg/m   Physical Exam Constitutional:      Appearance: Normal appearance.  Eyes:     General: No scleral icterus. Cardiovascular:     Rate and Rhythm: Normal rate and regular rhythm.  Pulmonary:     Effort: Pulmonary effort is normal.     Breath sounds: Normal breath sounds.  Musculoskeletal:     Cervical back: Neck supple.  Neurological:     General: No focal deficit present.     Mental Status: She is  alert.  Psychiatric:        Mood and Affect: Mood normal.        Behavior: Behavior normal.     ------------------------------------------------------------------------------------------------------------------------------------------------------------------------------------------------------------------- Assessment and Plan  Elevated liver enzymes She has been off of atorvastatin for nearly 1 month.  Updating hepatic panel today.  Orthostatic hypotension She has continued on losartan 50 mg.  No longer taking hydrochlorothiazide.  Blood pressure is elevated today.  We will increase her losartan to 100 mg.  Dizziness Continues to have episodes of dizziness.  We will refer her to vestibular rehab to see if this provides any relief for her symptoms.  Encounter for screening mammogram for malignant neoplasm of breast Mammogram ordered.  Osteoporosis She will continue Fosamax.  Updating DEXA.  Other fatigue Unclear etiology of her fatigue.  She has had extensive lab work-up.  Denies depressive symptoms.   Meds ordered this encounter  Medications   losartan (COZAAR) 100 MG tablet    Sig: Take 1 tablet (100 mg total) by mouth daily.    Dispense:  90 tablet    Refill:  0    No follow-ups on file.    This visit occurred during the SARS-CoV-2 public health emergency.  Safety protocols were in place, including screening questions prior to the visit, additional usage of staff PPE, and extensive cleaning of exam room while observing appropriate contact time as indicated for disinfecting solutions.

## 2021-07-04 NOTE — Patient Instructions (Addendum)
Increase losartan to 100mg  daily.  Continue to check blood pressure at home.   I have placed a referral to physical therapy to help with the dizziness.  You will be contacted to set up an appointment.    We'll be in touch with lab results.   You are due for updated bone density and mammogram. I have ordered these an you be contacted to schedule.   See me again in 2 months.

## 2021-07-04 NOTE — Assessment & Plan Note (Signed)
She will continue Fosamax.  Updating DEXA.

## 2021-07-04 NOTE — Assessment & Plan Note (Signed)
She has been off of atorvastatin for nearly 1 month.  Updating hepatic panel today.

## 2021-07-04 NOTE — Assessment & Plan Note (Signed)
She has continued on losartan 50 mg.  No longer taking hydrochlorothiazide.  Blood pressure is elevated today.  We will increase her losartan to 100 mg.

## 2021-07-04 NOTE — Assessment & Plan Note (Signed)
Mammogram ordered

## 2021-07-05 LAB — HEPATIC FUNCTION PANEL
AG Ratio: 1.1 (calc) (ref 1.0–2.5)
ALT: 40 U/L — ABNORMAL HIGH (ref 6–29)
AST: 25 U/L (ref 10–35)
Albumin: 4.1 g/dL (ref 3.6–5.1)
Alkaline phosphatase (APISO): 49 U/L (ref 37–153)
Bilirubin, Direct: 0.2 mg/dL (ref 0.0–0.2)
Globulin: 3.6 g/dL (calc) (ref 1.9–3.7)
Indirect Bilirubin: 0.7 mg/dL (calc) (ref 0.2–1.2)
Total Bilirubin: 0.9 mg/dL (ref 0.2–1.2)
Total Protein: 7.7 g/dL (ref 6.1–8.1)

## 2021-07-05 LAB — BASIC METABOLIC PANEL
BUN: 15 mg/dL (ref 7–25)
CO2: 30 mmol/L (ref 20–32)
Calcium: 9.5 mg/dL (ref 8.6–10.4)
Chloride: 105 mmol/L (ref 98–110)
Creat: 0.76 mg/dL (ref 0.60–1.00)
Glucose, Bld: 90 mg/dL (ref 65–99)
Potassium: 3.5 mmol/L (ref 3.5–5.3)
Sodium: 143 mmol/L (ref 135–146)

## 2021-07-11 ENCOUNTER — Ambulatory Visit: Payer: Medicaid Other | Admitting: Sports Medicine

## 2021-07-16 ENCOUNTER — Ambulatory Visit: Payer: Medicaid Other | Attending: Family Medicine | Admitting: Physical Therapy

## 2021-07-16 DIAGNOSIS — R262 Difficulty in walking, not elsewhere classified: Secondary | ICD-10-CM

## 2021-07-16 DIAGNOSIS — R42 Dizziness and giddiness: Secondary | ICD-10-CM | POA: Diagnosis not present

## 2021-07-16 DIAGNOSIS — R2689 Other abnormalities of gait and mobility: Secondary | ICD-10-CM

## 2021-07-16 NOTE — Therapy (Signed)
[facility] [place] [address] [place] [place], [place], [address] Phone: [phone]   Fax:  [phone]  Physical Therapy Evaluation  Patient Details  Name: Katrina Baird MRN: [id number] Date of Birth: 06-23-1950 Referring Provider (PT): Everrett Coombe, DO  Rationale for Evaluation and Treatment Rehabilitation  Encounter Date: 07/16/2021   PT End of Session - 07/16/21 1247     Visit Number 1    Number of Visits 12    Date for PT Re-Evaluation 08/27/21    Authorization Type [employer]    PT Start Time 1150    PT Stop Time 1240    PT Time Calculation (min) 50 min    Activity Tolerance Patient tolerated treatment well    Behavior During Therapy [facility] for tasks assessed/performed             Past Medical History:  Diagnosis Date   Hyperlipidemia with target LDL less than 130 09/23/2012   Hypertension    Osteoporosis 02/03/2015   Vertigo     No past surgical history on file.  There were no vitals filed for this visit.    Subjective Assessment - 07/16/21 1152     Subjective "In my body, I don't feel well. I feel fatigued and not a lot of energy. When I walk I have achiness in my joints." Pt reports "dizziness" when she's walking -- feels that her joints are giving out. Pt states when she stands quickly she feels like her head is spinning. Turning sideways quickly she also feels she is spinning. Most sudden movements make her feel dizzy. Reports history of some neck soreness/stiffness    Patient is accompained by: Interpreter;Family member   [place] interpreter and granddaughter present   How long can you sit comfortably? n/a    How long can you stand comfortably? n/a    How long can you walk comfortably? n/a    Patient Stated Goals Improve dizziness    Currently in Pain? No/denies                [facility] PT Assessment - 07/16/21 0001       Assessment   Medical Diagnosis R42 (ICD-10-CM) - Dizziness    Referring  Provider (PT) Everrett Coombe, DO      Ambulation/Gait   Gait velocity 0.67 m/s                    Vestibular Assessment - 07/16/21 0001       Symptom Behavior   Subjective history of current problem Pt states this has been ongoing for the last 2-3 months. Occurred gradually.    Type of Dizziness  Spinning;Unsteady with head/body turns    Frequency of Dizziness With quick head movements, changes of position. Has been getting it daily    Duration of Dizziness A few seconds    Symptom Nature Motion provoked    Aggravating Factors Turning head quickly;Supine to sit;Sit to stand    Relieving Factors Slow movements;Rest    Progression of Symptoms No change since onset    History of similar episodes None      Oculomotor Exam   Oculomotor Alignment Normal    Ocular ROM WFL    Spontaneous Absent    Smooth Pursuits Intact    Saccades Dysmetria;Slow   "a little dizzy"     Oculomotor Exam-Fixation Suppressed    Ocular Alignment WFL    Ocular ROM WFL    Spontaneous Nystagmus none  Gaze evoked nystagmus none    Left Head Impulse Increased dizziness    Right Head Impulse Increased dizziness      Vestibulo-Ocular Reflex   VOR 1 Head Only (x 1 viewing) Increased dizziness    VOR to Slow Head Movement Positive bilaterally   Difficulty maintaining gaze   VOR Cancellation Normal      Positional Testing   Sidelying Test Sidelying Right;Sidelying Left    Horizontal Canal Testing Horizontal Canal Right;Horizontal Canal Left      Sidelying Right   Sidelying Right Duration 0      Sidelying Left   Sidelying Left Duration 0      Horizontal Canal Right   Horizontal Canal Right Duration 0      Horizontal Canal Left   Horizontal Canal Left Duration 0      Positional Sensitivities   Sit to Supine Mild dizziness    Supine to Sitting Severe dizziness    Up from Right Hallpike Severe dizziness    Up from Left Hallpike Severe dizziness                Objective  measurements completed on examination: See above findings.                PT Education - 07/16/21 1246     Education Details Discussed exam findings, POC, and initial HEP    Person(s) Educated Patient;Other (comment)   grand daughter   Methods Explanation;Demonstration;Tactile cues;Verbal cues;Handout    Comprehension Verbalized understanding;Returned demonstration;Verbal cues required;Tactile cues required;Need further instruction                 PT Long Term Goals - 07/16/21 1259       PT LONG TERM GOAL #1   Title Pt will be independent with HEP    Time 6    Period Weeks    Status New    Target Date 08/27/21      PT LONG TERM GOAL #2   Title Pt will report >/=50% improvement in dizziness    Time 6    Period Weeks    Status New    Target Date 08/27/21      PT LONG TERM GOAL #3   Title Pt will be able to maintain gaze stability during VORx1 at 120 bpm with </=2/5 dizziness to demo improving VOR function    Time 6    Period Weeks    Status New    Target Date 08/27/21      PT LONG TERM GOAL #4   Title Pt will demo FGA of at least 6 points to demo MDC    Time 6    Period Weeks    Status New    Target Date 08/27/21      PT LONG TERM GOAL #5   Title Pt will demo improved gait speed to at least 0.8 m/s for community amb    Baseline 0.67 m/s    Time 6    Period Weeks    Status New    Target Date 08/27/21                    Plan - 07/16/21 1247     Clinical Impression Statement Ms. Katrina Baird is a 71 y/o F presenting to OPPT due to complaint of insiduous onset of dizziness x2-3 months. On assessment, pt demos stiff neck movements with decreased ROM and slow/guarded movements, decreased gaze stability with dizziness during eye movement. BPPV assessment  was (-); however, pt did demonstrate positional sensitivities. 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 Pt would highly benefit from vestibular therapy to improve on these deficits to decrease her risk of falls and improve her comfort with mobility. Pt has plans to return to [place] at the end of this month.    Personal Factors and Comorbidities Age;Time since onset of injury/illness/exacerbation    Examination-Activity Limitations Locomotion Level;Transfers;Bend;Bed Mobility    Examination-Participation Restrictions Community Activity;Cleaning    Stability/Clinical Decision Making Evolving/Moderate complexity    Clinical Decision Making Moderate    Rehab Potential Good    PT Frequency 2x / week    PT Duration 6 weeks    PT Treatment/Interventions ADLs/Self Care Home Management;Gait training;Functional mobility training;Therapeutic activities;Therapeutic exercise;Balance training;Neuromuscular re-education;Vestibular;Taping;Manual techniques;Dry needling    PT Next Visit Plan Assess response to HEP. Continue to work on gaze stabilization. Initiate habituation exercises. Assess FGA.    PT Home Exercise Plan Access Code: [email or web address]    Consulted and Agree with Plan of Care Patient;Family member/caregiver    Family Member Consulted granddaughter             Patient will benefit from skilled therapeutic intervention in order to improve the following deficits and impairments:  Abnormal gait, Difficulty walking, Dizziness, Decreased activity tolerance, Decreased balance, Decreased mobility  Visit Diagnosis: Dizziness and giddiness  Other abnormalities of gait and mobility  Difficulty in walking, not elsewhere classified     Problem List Patient Active Problem List   Diagnosis Date Noted   Encounter for screening mammogram for malignant neoplasm of breast 07/04/2021   Orthostatic hypotension 06/13/2021   Pyuria 06/13/2021   Nausea 04/23/2021   AKI (acute kidney injury) (HCC) 04/19/2021   Elevated liver enzymes 02/27/2021   Dizziness 02/27/2021   No appetite 02/27/2021    Other fatigue 02/27/2021   Biceps tendinitis, left, distal 12/25/2020   Glucose intolerance (impaired glucose tolerance) 06/20/2020   Osteoporosis 02/03/2015   Axillary lump 01/31/2015   Colon cancer screening 03/30/2014   Visit for screening mammogram 03/08/2013   Insomnia 03/08/2013   Thoracic back pain 09/23/2012   Essential hypertension, benign 09/23/2012   Hyperlipidemia with target LDL less than 130 09/23/2012    Kally Cadden April Ma L Malaka Ruffner, PT, DPT 07/16/2021, 1:15 PM  [facility] [address] [place] [address] [place], [place], [address] Phone: [phone]   Fax:  [phone]  Name: Brandilynn Taormina MRN: [id number] Date of Birth: [birth year]

## 2021-07-24 ENCOUNTER — Ambulatory Visit: Payer: Medicaid Other | Admitting: Physical Therapy

## 2021-07-24 DIAGNOSIS — R2689 Other abnormalities of gait and mobility: Secondary | ICD-10-CM

## 2021-07-24 DIAGNOSIS — R42 Dizziness and giddiness: Secondary | ICD-10-CM | POA: Diagnosis not present

## 2021-07-24 DIAGNOSIS — R262 Difficulty in walking, not elsewhere classified: Secondary | ICD-10-CM

## 2021-07-24 NOTE — Therapy (Signed)
[facility] Out[facility] Rehabilitation [place] [address] [place] [address] [place], [place], [address] Phone: [phone]   Fax:  [phone]  Physical Therapy Treatment  Patient Details  Name: Katrina Baird MRN: [id number] Date of Birth: 23-Nov-1950 Referring Provider (PT): Everrett Coombe, DO  Rationale for Evaluation and Treatment Rehabilitation  Encounter Date: 07/24/2021   PT End of Session - 07/24/21 0801     Visit Number 2    Number of Visits 12    Date for PT Re-Evaluation 08/27/21    Authorization Type [employer]    PT Start Time 0801    PT Stop Time 0845    PT Time Calculation (min) 44 min    Activity Tolerance Patient tolerated treatment well    Behavior During Therapy [facility] for tasks assessed/performed             Past Medical History:  Diagnosis Date   Hyperlipidemia with target LDL less than 130 09/23/2012   Hypertension    Osteoporosis 02/03/2015   Vertigo     No past surgical history on file.  There were no vitals filed for this visit.   Subjective Assessment - 07/24/21 0803     Subjective Pt reports she's been able to do the exercises. Reports no changes or improvement. Pt reports her neck aches a lot. Sitting and lying the dizziness is the same.    Patient is accompained by: Interpreter;Family member   [place] interpreter and granddaughter present   How long can you sit comfortably? n/a    How long can you stand comfortably? n/a    How long can you walk comfortably? n/a    Patient Stated Goals Improve dizziness    Currently in Pain? No/denies                [facility] PT Assessment - 07/24/21 0001       Assessment   Medical Diagnosis vestibular                           [facility] Adult PT Treatment/Exercise - 07/24/21 0001       Exercises   Exercises Neck      Neck Exercises: Stretches   Upper Trapezius Stretch Right;Left;30 seconds    Levator Stretch Right;Left;30 seconds    Other Neck Stretches  Cervical rotation x30 sec at end range x30 sec      Manual Therapy   Manual Therapy Joint mobilization;Soft tissue mobilization    Joint Mobilization Cervical PAs, UPAs, side glides, rotation; Cervical PROM in all directions 2x30 sec each    Soft tissue mobilization TPR & STM bilat cervical paraspinals & suboccipital             Vestibular Treatment/Exercise - 07/24/21 0001       Vestibular Treatment/Exercise   Vestibular Treatment Provided Gaze    Habituation Exercises Austin Miles    Gaze Exercises X1 Viewing Horizontal;X1 Viewing Vertical;Eye/Head Exercise Horizontal;Eye/Head Exercise Vertical      Brandt Daroff   Number of Reps  1      X1 Viewing Horizontal   Foot Position supine    Comments 2x30 sec      X1 Viewing Vertical   Foot Position supine    Comments 2x30 sec      Eye/Head Exercise Horizontal   Foot Position supine    Comments 2x30 sec saccades & smooth pursuit      Eye/Head Exercise Vertical   Foot Position supine  Comments 2x30 sec saccades & smooth pursuit                         PT Long Term Goals - 07/16/21 1259       PT LONG TERM GOAL #1   Title Pt will be independent with HEP    Time 6    Period Weeks    Status New    Target Date 08/27/21      PT LONG TERM GOAL #2   Title Pt will report >/=50% improvement in dizziness    Time 6    Period Weeks    Status New    Target Date 08/27/21      PT LONG TERM GOAL #3   Title Pt will be able to maintain gaze stability during VORx1 at 120 bpm with </=2/5 dizziness to demo improving VOR function    Time 6    Period Weeks    Status New    Target Date 08/27/21      PT LONG TERM GOAL #4   Title Pt will demo FGA of at least 6 points to demo MDC    Time 6    Period Weeks    Status New    Target Date 08/27/21      PT LONG TERM GOAL #5   Title Pt will demo improved gait speed to at least 0.8 m/s for community amb    Baseline 0.67 m/s    Time 6    Period Weeks    Status New     Target Date 08/27/21                   Plan - 07/24/21 0950     Clinical Impression Statement Pt notes no improvements with her dizziness. Due to her continued neck stiffness and decreased ROM, consider more cervicogenic origin for her dizziness. Pt did get dizzy symptoms during manual work to address her neck aching. Initiated neck stretching for pt to perform at home. Continued to work on her habituation and adaptation exercises -- gets dizzy with oculomotor movements but worse with gaze stabilization.    Personal Factors and Comorbidities Age;Time since onset of injury/illness/exacerbation    Examination-Activity Limitations Locomotion Level;Transfers;Bend;Bed Mobility    Examination-Participation Restrictions Community Activity;Cleaning    Stability/Clinical Decision Making Evolving/Moderate complexity    Rehab Potential Good    PT Frequency 2x / week    PT Duration 6 weeks    PT Treatment/Interventions ADLs/Self Care Home Management;Gait training;Functional mobility training;Therapeutic activities;Therapeutic exercise;Balance training;Neuromuscular re-education;Vestibular;Taping;Manual techniques;Dry needling    PT Next Visit Plan Assess response to HEP. Continue to work on gaze stabilization. Initiate habituation exercises. Assess FGA.    PT Home Exercise Plan Access Code: [email or web address]    Consulted and Agree with Plan of Care Patient;Family member/caregiver    Family Member Consulted granddaughter             Patient will benefit from skilled therapeutic intervention in order to improve the following deficits and impairments:  Abnormal gait, Difficulty walking, Dizziness, Decreased activity tolerance, Decreased balance, Decreased mobility  Visit Diagnosis: Dizziness and giddiness  Other abnormalities of gait and mobility  Difficulty in walking, not elsewhere classified     Problem List Patient Active Problem List   Diagnosis Date Noted   Encounter for screening  mammogram for malignant neoplasm of breast 07/04/2021   Orthostatic hypotension 06/13/2021   Pyuria 06/13/2021   Nausea 04/23/2021   AKI (  acute kidney injury) (HCC) 04/19/2021   Elevated liver enzymes 02/27/2021   Dizziness 02/27/2021   No appetite 02/27/2021   Other fatigue 02/27/2021   Biceps tendinitis, left, distal 12/25/2020   Glucose intolerance (impaired glucose tolerance) 06/20/2020   Osteoporosis 02/03/2015   Axillary lump 01/31/2015   Colon cancer screening 03/30/2014   Visit for screening mammogram 03/08/2013   Insomnia 03/08/2013   Thoracic back pain 09/23/2012   Essential hypertension, benign 09/23/2012   Hyperlipidemia with target LDL less than 130 09/23/2012    Omaria Plunk April Ma L Jaydyn Bozzo, PT, DPT 07/24/2021, 9:54 AM  [facility] [address] [place], [place], [address] Phone: [phone]   Fax:  [phone]  Name: Katrina Baird MRN: [id number] Date of Birth: [birth year]

## 2021-07-25 ENCOUNTER — Ambulatory Visit (INDEPENDENT_AMBULATORY_CARE_PROVIDER_SITE_OTHER): Payer: Medicaid Other

## 2021-07-25 ENCOUNTER — Other Ambulatory Visit: Payer: Self-pay | Admitting: Osteopathic Medicine

## 2021-07-25 ENCOUNTER — Ambulatory Visit: Payer: Medicaid Other

## 2021-07-25 DIAGNOSIS — Z1231 Encounter for screening mammogram for malignant neoplasm of breast: Secondary | ICD-10-CM | POA: Diagnosis not present

## 2021-07-25 DIAGNOSIS — E876 Hypokalemia: Secondary | ICD-10-CM

## 2021-07-27 ENCOUNTER — Other Ambulatory Visit: Payer: Self-pay

## 2021-07-27 ENCOUNTER — Other Ambulatory Visit: Payer: Self-pay | Admitting: Family Medicine

## 2021-07-27 ENCOUNTER — Ambulatory Visit: Payer: Medicaid Other | Admitting: Physical Therapy

## 2021-07-27 DIAGNOSIS — R42 Dizziness and giddiness: Secondary | ICD-10-CM | POA: Diagnosis not present

## 2021-07-27 DIAGNOSIS — R2689 Other abnormalities of gait and mobility: Secondary | ICD-10-CM

## 2021-07-27 DIAGNOSIS — R262 Difficulty in walking, not elsewhere classified: Secondary | ICD-10-CM

## 2021-07-27 DIAGNOSIS — R928 Other abnormal and inconclusive findings on diagnostic imaging of breast: Secondary | ICD-10-CM

## 2021-07-27 MED ORDER — ALENDRONATE SODIUM 10 MG PO TABS: 10.0000 mg | ORAL_TABLET | Freq: Every day | ORAL | 3 refills | Status: AC

## 2021-07-27 MED ORDER — LOSARTAN POTASSIUM 100 MG PO TABS: 100.0000 mg | ORAL_TABLET | Freq: Every day | ORAL | 3 refills | Status: AC

## 2021-07-27 NOTE — Therapy (Addendum)
[place], [place], [address] Phone: [phone]   Fax:  [phone]  Physical Therapy Treatment and Discharge  Patient Details  Name: Katrina Baird MRN: [id number] Date of Birth: 07/01/1950 Referring Provider (PT): Luetta Nutting, DO  PHYSICAL THERAPY DISCHARGE SUMMARY  Visits from Start of Care: 3  Current functional level related to goals / functional outcomes: See below   Remaining deficits: No changes   Education / Equipment: See below   Patient agrees to discharge. Patient goals were not met. Patient is being discharged due to did not respond to therapy.    Rationale for Evaluation and Treatment Rehabilitation  Encounter Date: 07/27/2021   PT End of Session - 07/27/21 0851     Visit Number 3    Number of Visits 12    Date for PT Re-Evaluation 08/27/21    Authorization Type [employer]    PT Start Time [phone]    PT Stop Time 0930    PT Time Calculation (min) 40 min    Activity Tolerance Patient tolerated treatment well    Behavior During Therapy [facility] for tasks assessed/performed             Past Medical History:  Diagnosis Date   Hyperlipidemia with target LDL less than 130 09/23/2012   Hypertension    Osteoporosis 02/03/2015   Vertigo     No past surgical history on file.  There were no vitals filed for this visit.   Subjective Assessment - 07/27/21 0852     Subjective Pt reports neck pain has improved but dizziness remains about the same.    Patient is accompained by: Interpreter;Family member   [place] interpreter and granddaughter present   How long can you sit comfortably? n/a    How long can you stand comfortably? n/a    How long can you walk comfortably? n/a    Patient Stated Goals Improve dizziness                [facility] PT Assessment - 07/27/21 0001       Functional Gait  Assessment   Gait assessed  Yes    Gait Level Surface Walks 20 ft in  less than 7 sec but greater than 5.5 sec, uses assistive device, slower speed, mild gait deviations, or deviates 6-10 in outside of the 12 in walkway width.    Change in Gait Speed Makes only minor adjustments to walking speed, or accomplishes a change in speed with significant gait deviations, deviates 10-15 in outside the 12 in walkway width, or changes speed but loses balance but is able to recover and continue walking.    Gait with Horizontal Head Turns Performs head turns smoothly with no change in gait. Deviates no more than 6 in outside 12 in walkway width    Gait with Vertical Head Turns Performs task with slight change in gait velocity (eg, minor disruption to smooth gait path), deviates 6 - 10 in outside 12 in walkway width or uses assistive device    Gait and Pivot Turn Pivot turns safely in greater than 3 sec and stops with no loss of balance, or pivot turns safely within 3 sec and stops with mild imbalance, requires small steps to catch balance.    Step Over Obstacle Is able to step over 2 stacked shoe boxes taped together (9 in total height) without changing gait speed. No evidence of imbalance.    Gait with Narrow Base of Support  Is able to ambulate for 10 steps heel to toe with no staggering.    Gait with Eyes Closed Walks 20 ft, uses assistive device, slower speed, mild gait deviations, deviates 6-10 in outside 12 in walkway width. Ambulates 20 ft in less than 9 sec but greater than 7 sec.    Ambulating Backwards Walks 20 ft, uses assistive device, slower speed, mild gait deviations, deviates 6-10 in outside 12 in walkway width.    Steps Alternating feet, must use rail.    Total Score 22    FGA comment: 22/30                            Vestibular Treatment/Exercise - 07/27/21 0001       Nestor Lewandowsky   Number of Reps  3            Seated: Looking up/down 2x10 Looking side to side 2x10 Lean forward over left knee x10 EO x10 EC Lean forward over right  knee x10 EO x10 EC Horizontal head turns x10 EO x10 EC Touch the floor x10 EO x10 EC Turning shoulders x10 EO x10 EC Shoulder shrugs x10 forward x10 backward         PT Long Term Goals - 07/16/21 1259       PT LONG TERM GOAL #1   Title Pt will be independent with HEP    Time 6    Period Weeks    Status New    Target Date 08/27/21      PT LONG TERM GOAL #2   Title Pt will report >/=50% improvement in dizziness    Time 6    Period Weeks    Status New    Target Date 08/27/21      PT LONG TERM GOAL #3   Title Pt will be able to maintain gaze stability during VORx1 at 120 bpm with </=2/5 dizziness to demo improving VOR function    Time 6    Period Weeks    Status New    Target Date 08/27/21      PT LONG TERM GOAL #4   Title Pt will demo FGA of at least 6 points to demo [place]    Time 6    Period Weeks    Status New    Target Date 08/27/21      PT LONG TERM GOAL #5   Title Pt will demo improved gait speed to at least 0.8 m/s for community amb    Baseline 0.67 m/s    Time 6    Period Weeks    Status New    Target Date 08/27/21                   Plan - 07/27/21 0936     Clinical Impression Statement Pt no longer has any neck pain. Dizziness remains the same. Modified exercises to be less dizzying -- discussed that if there still is no change in the next week that she may need to get referred out to ENT. Initiated seated [employer] exercises with improved pt tolerance. FGA score of 22/30 places her at a low fall risk.    Personal Factors and Comorbidities Age;Time since onset of injury/illness/exacerbation    Examination-Activity Limitations Locomotion Level;Transfers;Bend;Bed Mobility    Examination-Participation Restrictions Community Activity;Cleaning    Stability/Clinical Decision Making Evolving/Moderate complexity    Rehab Potential Good    PT Frequency 2x / week  PT Duration 6 weeks    PT Treatment/Interventions ADLs/Self Care Home  Management;Gait training;Functional mobility training;Therapeutic activities;Therapeutic exercise;Balance training;Neuromuscular re-education;Vestibular;Taping;Manual techniques;Dry needling    PT Next Visit Plan Assess response to HEP. Continue to work on gaze stabilization. Initiate habituation exercises.    PT Home Exercise Plan Access Code: [email or web address]    Consulted and Agree with Plan of Care Patient;Family member/caregiver    Family Member Consulted granddaughter             Patient will benefit from skilled therapeutic intervention in order to improve the following deficits and impairments:  Abnormal gait, Difficulty walking, Dizziness, Decreased activity tolerance, Decreased balance, Decreased mobility  Visit Diagnosis: Dizziness and giddiness  Other abnormalities of gait and mobility  Difficulty in walking, not elsewhere classified     Problem List Patient Active Problem List   Diagnosis Date Noted   Encounter for screening mammogram for malignant neoplasm of breast 07/04/2021   Orthostatic hypotension 06/13/2021   Pyuria 06/13/2021   Nausea 04/23/2021   AKI (acute kidney injury) ([place]) 04/19/2021   Elevated liver enzymes 02/27/2021   Dizziness 02/27/2021   No appetite 02/27/2021   Other fatigue 02/27/2021   Biceps tendinitis, left, distal 12/25/2020   Glucose intolerance (impaired glucose tolerance) 06/20/2020   Osteoporosis 02/03/2015   Axillary lump 01/31/2015   Colon cancer screening 03/30/2014   Visit for screening mammogram 03/08/2013   Insomnia 03/08/2013   Thoracic back pain 09/23/2012   Essential hypertension, benign 09/23/2012   Hyperlipidemia with target LDL less than 130 09/23/2012    Rahm Minix April Gordy Levan, PT, DPT 07/27/2021, 9:38 AM  [facility] [place], [place], [address] Phone: [phone]   Fax:  [phone]  Name: Katrina Baird MRN: [id number] Date of Birth:  1950-12-22

## 2021-07-30 DIAGNOSIS — R110 Nausea: Secondary | ICD-10-CM | POA: Diagnosis not present

## 2021-07-30 DIAGNOSIS — R630 Anorexia: Secondary | ICD-10-CM | POA: Diagnosis not present

## 2021-07-30 DIAGNOSIS — R5383 Other fatigue: Secondary | ICD-10-CM | POA: Diagnosis not present

## 2021-07-30 DIAGNOSIS — Z1211 Encounter for screening for malignant neoplasm of colon: Secondary | ICD-10-CM | POA: Diagnosis not present

## 2021-07-30 DIAGNOSIS — R7989 Other specified abnormal findings of blood chemistry: Secondary | ICD-10-CM | POA: Diagnosis not present

## 2021-07-31 ENCOUNTER — Ambulatory Visit: Payer: Medicaid Other | Admitting: Physical Therapy

## 2021-08-01 ENCOUNTER — Ambulatory Visit (INDEPENDENT_AMBULATORY_CARE_PROVIDER_SITE_OTHER): Payer: Medicaid Other

## 2021-08-01 DIAGNOSIS — M810 Age-related osteoporosis without current pathological fracture: Secondary | ICD-10-CM | POA: Diagnosis not present

## 2021-08-03 ENCOUNTER — Ambulatory Visit: Payer: Medicaid Other | Admitting: Physical Therapy

## 2021-08-08 ENCOUNTER — Other Ambulatory Visit: Payer: Medicaid Other

## 2021-09-03 ENCOUNTER — Ambulatory Visit: Payer: Medicaid Other | Admitting: Family Medicine

## 2021-10-18 ENCOUNTER — Telehealth: Payer: Self-pay

## 2021-10-18 NOTE — Telephone Encounter (Signed)
I called the patient to schedule a breast imaging appointment and her daughter stated that she moved back to her [place].- tvt

## 2021-10-22 NOTE — Telephone Encounter (Signed)
Noted.  Please let them know that if she returns we'll need to get this set up.   Thanks!  CM

## 2022-04-30 ENCOUNTER — Telehealth: Payer: Self-pay | Admitting: Family Medicine

## 2022-04-30 NOTE — Telephone Encounter (Signed)
Contacted patient to schedule apt with PCP for follow up on Chronic Conditions-LVM to call back. AS, CMA

## 2023-09-18 ENCOUNTER — Encounter: Payer: Self-pay | Admitting: Sports Medicine
